# Patient Record
Sex: Female | Born: 1965 | Race: White | Hispanic: No | Marital: Married | Smoking: Former smoker
Health system: Southern US, Community
[De-identification: ages and names within clinical notes are randomized; demographics above are authoritative.]

## PROBLEM LIST (undated history)

## (undated) DIAGNOSIS — M545 Low back pain, unspecified: Secondary | ICD-10-CM

## (undated) DIAGNOSIS — M1990 Unspecified osteoarthritis, unspecified site: Secondary | ICD-10-CM

## (undated) DIAGNOSIS — G629 Polyneuropathy, unspecified: Secondary | ICD-10-CM

## (undated) DIAGNOSIS — T4145XA Adverse effect of unspecified anesthetic, initial encounter: Secondary | ICD-10-CM

## (undated) DIAGNOSIS — M542 Cervicalgia: Secondary | ICD-10-CM

## (undated) DIAGNOSIS — M62838 Other muscle spasm: Secondary | ICD-10-CM

## (undated) DIAGNOSIS — G709 Myoneural disorder, unspecified: Secondary | ICD-10-CM

## (undated) DIAGNOSIS — R011 Cardiac murmur, unspecified: Secondary | ICD-10-CM

## (undated) DIAGNOSIS — T8859XA Other complications of anesthesia, initial encounter: Secondary | ICD-10-CM

## (undated) DIAGNOSIS — E162 Hypoglycemia, unspecified: Secondary | ICD-10-CM

## (undated) DIAGNOSIS — R531 Weakness: Secondary | ICD-10-CM

## (undated) DIAGNOSIS — G8929 Other chronic pain: Secondary | ICD-10-CM

## (undated) DIAGNOSIS — Z8709 Personal history of other diseases of the respiratory system: Secondary | ICD-10-CM

## (undated) HISTORY — DX: Myoneural disorder, unspecified: G70.9

## (undated) HISTORY — PX: MOUTH SURGERY: SHX715

## (undated) HISTORY — PX: TUBAL LIGATION: SHX77

## (undated) HISTORY — PX: OTHER SURGICAL HISTORY: SHX169

---

## 2015-09-10 ENCOUNTER — Other Ambulatory Visit: Payer: Self-pay | Admitting: Orthopaedic Surgery

## 2015-09-10 DIAGNOSIS — M542 Cervicalgia: Secondary | ICD-10-CM

## 2015-09-15 ENCOUNTER — Ambulatory Visit
Admission: RE | Admit: 2015-09-15 | Discharge: 2015-09-15 | Disposition: A | Discharge status: Home / Self Care | Payer: 59 | Source: Ambulatory Visit | Attending: Orthopaedic Surgery | Admitting: Orthopaedic Surgery

## 2015-09-15 DIAGNOSIS — M542 Cervicalgia: Secondary | ICD-10-CM

## 2015-10-03 ENCOUNTER — Other Ambulatory Visit: Payer: Self-pay | Admitting: Neurosurgery

## 2015-10-10 NOTE — Pre-Procedure Instructions (Signed)
Reagyn Loretto  10/10/2015      [facility] - [place], [address] AT [address] [place] [address] Phone: [phone] Fax: [phone]    Your procedure is scheduled on Wed, June 14 @ 1:30 PM  Report to [facility] Admitting at 11:30 AM  Call this number if you have problems the morning of surgery:  [phone]   Remember:  Do not eat food or drink liquids after midnight.  Take these medicines the morning of surgery with A SIP OF WATER Gabapentin(Neurontin)              Stop taking your Meloxicam,Fish Oil,Vit E,along with any Vitamins or Herbal Medications. No Goody's,BC's,Aleve,Aspirin,Ibuprofen,or Advil.    Do not wear jewelry, make-up or nail polish.  Do not wear lotions, powders, or perfumes.    Do not shave 48 hours prior to surgery.   Do not bring valuables to the hospital.  [facility] is not responsible for any belongings or valuables.  Contacts, dentures or bridgework may not be worn into surgery.  Leave your suitcase in the car.  After surgery it may be brought to your room.  For patients admitted to the hospital, discharge time will be determined by your treatment team.  Patients discharged the day of surgery will not be allowed to drive home.    Special instructions:  - Preparing for Surgery  Before surgery, you can play an important role.  Because skin is not sterile, your skin needs to be as free of germs as possible.  You can reduce the number of germs on you skin by washing with CHG (chlorahexidine gluconate) soap before surgery.  CHG is an antiseptic cleaner which kills germs and bonds with the skin to continue killing germs even after washing.  Please DO NOT use if you have an allergy to CHG or antibacterial soaps.  If your skin becomes reddened/irritated stop using the CHG and inform your nurse when you arrive at Short Stay.  Do not shave  (including legs and underarms) for at least 48 hours prior to the first CHG shower.  You may shave your face.  Please follow these instructions carefully:   1.  Shower with CHG Soap the night before surgery and the                                morning of Surgery.  2.  If you choose to wash your hair, wash your hair first as usual with your       normal shampoo.  3.  After you shampoo, rinse your hair and body thoroughly to remove the                      Shampoo.  4.  Use CHG as you would any other liquid soap.  You can apply chg directly       to the skin and wash gently with scrungie or a clean washcloth.  5.  Apply the CHG Soap to your body ONLY FROM THE NECK DOWN.        Do not use on open wounds or open sores.  Avoid contact with your eyes,       ears, mouth and genitals (private parts).  Wash genitals (private parts)       with your normal soap.  6.  Wash thoroughly, paying special attention to the area where your surgery        will be performed.  7.  Thoroughly rinse your body with warm water from the neck down.  8.  DO NOT shower/wash with your normal soap after using and rinsing off       the CHG Soap.  9.  Pat yourself dry with a clean towel.            10.  Wear clean pajamas.            11.  Place clean sheets on your bed the night of your first shower and do not        sleep with pets.  Day of Surgery  Do not apply any lotions/deoderants the morning of surgery.  Please wear clean clothes to the hospital/surgery center.    Please read over the following fact sheets that you were given. MRSA Information

## 2015-10-11 ENCOUNTER — Encounter (HOSPITAL_COMMUNITY): Payer: Self-pay

## 2015-10-11 ENCOUNTER — Encounter (HOSPITAL_COMMUNITY)
Admission: RE | Admit: 2015-10-11 | Discharge: 2015-10-11 | Disposition: A | Discharge status: Home / Self Care | Payer: 59 | Source: Ambulatory Visit | Attending: Neurosurgery | Admitting: Neurosurgery

## 2015-10-11 DIAGNOSIS — M50221 Other cervical disc displacement at C4-C5 level: Secondary | ICD-10-CM | POA: Insufficient documentation

## 2015-10-11 DIAGNOSIS — Z01812 Encounter for preprocedural laboratory examination: Secondary | ICD-10-CM | POA: Insufficient documentation

## 2015-10-11 HISTORY — DX: Weakness: R53.1

## 2015-10-11 HISTORY — DX: Adverse effect of unspecified anesthetic, initial encounter: T41.45XA

## 2015-10-11 HISTORY — DX: Other complications of anesthesia, initial encounter: T88.59XA

## 2015-10-11 HISTORY — DX: Personal history of other diseases of the respiratory system: Z87.09

## 2015-10-11 HISTORY — DX: Other chronic pain: G89.29

## 2015-10-11 HISTORY — DX: Unspecified osteoarthritis, unspecified site: M19.90

## 2015-10-11 HISTORY — DX: Low back pain: M54.5

## 2015-10-11 HISTORY — DX: Other muscle spasm: M62.838

## 2015-10-11 HISTORY — DX: Polyneuropathy, unspecified: G62.9

## 2015-10-11 HISTORY — DX: Low back pain, unspecified: M54.50

## 2015-10-11 HISTORY — DX: Cardiac murmur, unspecified: R01.1

## 2015-10-11 HISTORY — DX: Cervicalgia: M54.2

## 2015-10-11 LAB — BASIC METABOLIC PANEL
ANION GAP: 5 (ref 5–15)
BUN: 12 mg/dL (ref 6–20)
CO2: 22 mmol/L (ref 22–32)
Calcium: 9.2 mg/dL (ref 8.9–10.3)
Chloride: 109 mmol/L (ref 101–111)
Creatinine, Ser: 0.68 mg/dL (ref 0.44–1.00)
GLUCOSE: 109 mg/dL — AB (ref 65–99)
POTASSIUM: 4 mmol/L (ref 3.5–5.1)
Sodium: 136 mmol/L (ref 135–145)

## 2015-10-11 LAB — CBC
HCT: 38.6 % (ref 36.0–46.0)
HEMOGLOBIN: 12.7 g/dL (ref 12.0–15.0)
MCH: 30 pg (ref 26.0–34.0)
MCHC: 32.9 g/dL (ref 30.0–36.0)
MCV: 91 fL (ref 78.0–100.0)
Platelets: 200 10*3/uL (ref 150–400)
RBC: 4.24 MIL/uL (ref 3.87–5.11)
RDW: 13.1 % (ref 11.5–15.5)
WBC: 7.7 10*3/uL (ref 4.0–10.5)

## 2015-10-11 LAB — HCG, SERUM, QUALITATIVE: PREG SERUM: NEGATIVE

## 2015-10-11 LAB — SURGICAL PCR SCREEN
MRSA, PCR: NEGATIVE
Staphylococcus aureus: NEGATIVE

## 2015-10-11 LAB — NO BLOOD PRODUCTS

## 2015-10-11 NOTE — Progress Notes (Addendum)
Cardiologist denies  Medical Md is with [facility] sees [place], [place]  Echo denies  Stress test deneis  Heart cath denies  EKG to be requested from [place] denies in past yr

## 2015-10-15 MED ORDER — DEXAMETHASONE SODIUM PHOSPHATE 10 MG/ML IJ SOLN
10.0000 mg | INTRAMUSCULAR | Status: AC
  Administered 2015-10-16: 10 mg via INTRAVENOUS

## 2015-10-16 ENCOUNTER — Inpatient Hospital Stay (HOSPITAL_COMMUNITY): Payer: 59

## 2015-10-16 ENCOUNTER — Inpatient Hospital Stay (HOSPITAL_COMMUNITY)
Admission: RE | Admit: 2015-10-16 | Discharge: 2015-10-17 | DRG: 472 | Disposition: A | Discharge status: Home / Self Care | Payer: 59 | Source: Ambulatory Visit | Attending: Neurosurgery | Admitting: Neurosurgery

## 2015-10-16 ENCOUNTER — Encounter (HOSPITAL_COMMUNITY)
Admission: RE | Disposition: A | Discharge status: Home / Self Care | Payer: Self-pay | Source: Ambulatory Visit | Attending: Neurosurgery

## 2015-10-16 ENCOUNTER — Encounter (HOSPITAL_COMMUNITY): Payer: Self-pay | Admitting: Surgery

## 2015-10-16 ENCOUNTER — Inpatient Hospital Stay (HOSPITAL_COMMUNITY): Payer: 59 | Admitting: Certified Registered Nurse Anesthetist

## 2015-10-16 DIAGNOSIS — M50021 Cervical disc disorder at C4-C5 level with myelopathy: Secondary | ICD-10-CM | POA: Diagnosis present

## 2015-10-16 DIAGNOSIS — Z888 Allergy status to other drugs, medicaments and biological substances status: Secondary | ICD-10-CM | POA: Diagnosis not present

## 2015-10-16 DIAGNOSIS — Z79899 Other long term (current) drug therapy: Secondary | ICD-10-CM

## 2015-10-16 DIAGNOSIS — G8929 Other chronic pain: Secondary | ICD-10-CM | POA: Diagnosis present

## 2015-10-16 DIAGNOSIS — M50022 Cervical disc disorder at C5-C6 level with myelopathy: Secondary | ICD-10-CM | POA: Diagnosis present

## 2015-10-16 DIAGNOSIS — Z880 Allergy status to penicillin: Secondary | ICD-10-CM

## 2015-10-16 DIAGNOSIS — M50023 Cervical disc disorder at C6-C7 level with myelopathy: Secondary | ICD-10-CM | POA: Diagnosis present

## 2015-10-16 DIAGNOSIS — Z882 Allergy status to sulfonamides status: Secondary | ICD-10-CM

## 2015-10-16 DIAGNOSIS — Z91040 Latex allergy status: Secondary | ICD-10-CM | POA: Diagnosis not present

## 2015-10-16 DIAGNOSIS — M47812 Spondylosis without myelopathy or radiculopathy, cervical region: Secondary | ICD-10-CM | POA: Diagnosis present

## 2015-10-16 DIAGNOSIS — M419 Scoliosis, unspecified: Secondary | ICD-10-CM | POA: Diagnosis present

## 2015-10-16 DIAGNOSIS — M4712 Other spondylosis with myelopathy, cervical region: Principal | ICD-10-CM | POA: Diagnosis present

## 2015-10-16 DIAGNOSIS — F17210 Nicotine dependence, cigarettes, uncomplicated: Secondary | ICD-10-CM | POA: Diagnosis present

## 2015-10-16 DIAGNOSIS — Z419 Encounter for procedure for purposes other than remedying health state, unspecified: Secondary | ICD-10-CM

## 2015-10-16 DIAGNOSIS — G629 Polyneuropathy, unspecified: Secondary | ICD-10-CM | POA: Diagnosis present

## 2015-10-16 DIAGNOSIS — M1990 Unspecified osteoarthritis, unspecified site: Secondary | ICD-10-CM | POA: Diagnosis present

## 2015-10-16 HISTORY — PX: ANTERIOR CERVICAL DECOMP/DISCECTOMY FUSION: SHX1161

## 2015-10-16 HISTORY — DX: Hypoglycemia, unspecified: E16.2

## 2015-10-16 SURGERY — ANTERIOR CERVICAL DECOMPRESSION/DISCECTOMY FUSION 3 LEVELS
Anesthesia: General | Site: Neck

## 2015-10-16 MED ORDER — VITAMIN D 1000 UNITS PO TABS: 1000.0000 [IU] | ORAL_TABLET | Freq: Every day | ORAL | Status: DC

## 2015-10-16 MED ORDER — ONDANSETRON HCL 4 MG/2ML IJ SOLN
INTRAMUSCULAR | Status: AC
  Filled 2015-10-16: qty 2

## 2015-10-16 MED ORDER — PHENOL 1.4 % MT LIQD: 1.0000 | OROMUCOSAL | Status: DC | PRN

## 2015-10-16 MED ORDER — THROMBIN 5000 UNITS EX SOLR
OROMUCOSAL | Status: DC | PRN
  Administered 2015-10-16: 13:00:00 via TOPICAL

## 2015-10-16 MED ORDER — LIDOCAINE 2% (20 MG/ML) 5 ML SYRINGE
INTRAMUSCULAR | Status: DC | PRN
  Administered 2015-10-16: 100 mg via INTRAVENOUS

## 2015-10-16 MED ORDER — FENTANYL CITRATE (PF) 250 MCG/5ML IJ SOLN
INTRAMUSCULAR | Status: DC | PRN
  Administered 2015-10-16: 150 ug via INTRAVENOUS
  Administered 2015-10-16 (×2): 50 ug via INTRAVENOUS

## 2015-10-16 MED ORDER — MIDAZOLAM HCL 2 MG/2ML IJ SOLN
INTRAMUSCULAR | Status: AC
  Filled 2015-10-16: qty 2

## 2015-10-16 MED ORDER — THROMBIN 20000 UNITS EX SOLR
CUTANEOUS | Status: DC | PRN
  Administered 2015-10-16: 13:00:00 via TOPICAL

## 2015-10-16 MED ORDER — ROCURONIUM BROMIDE 100 MG/10ML IV SOLN
INTRAVENOUS | Status: DC | PRN
  Administered 2015-10-16: 50 mg via INTRAVENOUS

## 2015-10-16 MED ORDER — VITAMIN E 180 MG (400 UNIT) PO CAPS: 400.0000 [IU] | ORAL_CAPSULE | Freq: Every day | ORAL | Status: DC

## 2015-10-16 MED ORDER — ONDANSETRON HCL 4 MG/2ML IJ SOLN
INTRAMUSCULAR | Status: DC | PRN
  Administered 2015-10-16: 4 mg via INTRAVENOUS

## 2015-10-16 MED ORDER — LACTATED RINGERS IV SOLN
INTRAVENOUS | Status: DC
  Administered 2015-10-16 (×3): via INTRAVENOUS

## 2015-10-16 MED ORDER — OMEGA-3-ACID ETHYL ESTERS 1 G PO CAPS
1.0000 g | ORAL_CAPSULE | Freq: Every day | ORAL | Status: DC
Start: 2015-10-16 — End: 2015-10-17
  Filled 2015-10-16 (×2): qty 1

## 2015-10-16 MED ORDER — TIZANIDINE HCL 4 MG PO TABS
4.0000 mg | ORAL_TABLET | Freq: Two times a day (BID) | ORAL | Status: DC
  Administered 2015-10-16: 4 mg via ORAL
  Filled 2015-10-16: qty 1

## 2015-10-16 MED ORDER — VANCOMYCIN HCL IN DEXTROSE 1-5 GM/200ML-% IV SOLN
1000.0000 mg | Freq: Once | INTRAVENOUS | Status: AC
  Administered 2015-10-16: 1000 mg via INTRAVENOUS
  Filled 2015-10-16: qty 200

## 2015-10-16 MED ORDER — FENTANYL CITRATE (PF) 100 MCG/2ML IJ SOLN
INTRAMUSCULAR | Status: AC
  Filled 2015-10-16: qty 2

## 2015-10-16 MED ORDER — EPHEDRINE 5 MG/ML INJ
INTRAVENOUS | Status: AC
  Filled 2015-10-16: qty 10

## 2015-10-16 MED ORDER — SUGAMMADEX SODIUM 200 MG/2ML IV SOLN
INTRAVENOUS | Status: DC | PRN
  Administered 2015-10-16: 180 mg via INTRAVENOUS

## 2015-10-16 MED ORDER — PHENYLEPHRINE HCL 10 MG/ML IJ SOLN
10.0000 mg | INTRAVENOUS | Status: DC | PRN
  Administered 2015-10-16: 25 ug/min via INTRAVENOUS

## 2015-10-16 MED ORDER — MIDAZOLAM HCL 2 MG/2ML IJ SOLN
INTRAMUSCULAR | Status: DC | PRN
Start: 2015-10-16 — End: 2015-10-16
  Administered 2015-10-16: 2 mg via INTRAVENOUS

## 2015-10-16 MED ORDER — MENTHOL 3 MG MT LOZG: 1.0000 | LOZENGE | OROMUCOSAL | Status: DC | PRN

## 2015-10-16 MED ORDER — FENTANYL CITRATE (PF) 100 MCG/2ML IJ SOLN
25.0000 ug | INTRAMUSCULAR | Status: DC | PRN
Start: 2015-10-16 — End: 2015-10-16
  Administered 2015-10-16 (×2): 50 ug via INTRAVENOUS

## 2015-10-16 MED ORDER — CYCLOBENZAPRINE HCL 10 MG PO TABS: 10.0000 mg | ORAL_TABLET | Freq: Three times a day (TID) | ORAL | Status: DC | PRN

## 2015-10-16 MED ORDER — SUGAMMADEX SODIUM 200 MG/2ML IV SOLN
INTRAVENOUS | Status: AC
  Filled 2015-10-16: qty 2

## 2015-10-16 MED ORDER — ROCURONIUM BROMIDE 50 MG/5ML IV SOLN
INTRAVENOUS | Status: AC
  Filled 2015-10-16: qty 1

## 2015-10-16 MED ORDER — CEFAZOLIN SODIUM 1-5 GM-% IV SOLN: 1.0000 g | Freq: Three times a day (TID) | INTRAVENOUS | Status: DC

## 2015-10-16 MED ORDER — MAGNESIUM 250 MG PO TABS: 1.0000 | ORAL_TABLET | Freq: Every day | ORAL | Status: DC

## 2015-10-16 MED ORDER — HYDROMORPHONE HCL 1 MG/ML IJ SOLN
0.5000 mg | INTRAMUSCULAR | Status: DC | PRN
  Administered 2015-10-16: 1 mg via INTRAVENOUS
  Filled 2015-10-16: qty 1

## 2015-10-16 MED ORDER — 0.9 % SODIUM CHLORIDE (POUR BTL) OPTIME
TOPICAL | Status: DC | PRN
  Administered 2015-10-16: 1000 mL

## 2015-10-16 MED ORDER — OXYCODONE-ACETAMINOPHEN 5-325 MG PO TABS
1.0000 | ORAL_TABLET | ORAL | Status: DC | PRN
  Administered 2015-10-17: 2 via ORAL
  Filled 2015-10-16: qty 2

## 2015-10-16 MED ORDER — LIDOCAINE 2% (20 MG/ML) 5 ML SYRINGE
INTRAMUSCULAR | Status: AC
  Filled 2015-10-16: qty 5

## 2015-10-16 MED ORDER — SODIUM CHLORIDE 0.9 % IR SOLN
Status: DC | PRN
  Administered 2015-10-16: 500 mL

## 2015-10-16 MED ORDER — ONDANSETRON HCL 4 MG/2ML IJ SOLN: 4.0000 mg | INTRAMUSCULAR | Status: DC | PRN

## 2015-10-16 MED ORDER — MAGNESIUM OXIDE 400 (241.3 MG) MG PO TABS
400.0000 mg | ORAL_TABLET | Freq: Every day | ORAL | Status: DC
  Filled 2015-10-16 (×2): qty 1

## 2015-10-16 MED ORDER — DEXAMETHASONE SODIUM PHOSPHATE 10 MG/ML IJ SOLN
INTRAMUSCULAR | Status: AC
  Filled 2015-10-16: qty 1

## 2015-10-16 MED ORDER — VITAMIN B-6 50 MG PO TABS
250.0000 mg | ORAL_TABLET | Freq: Every day | ORAL | Status: DC
  Filled 2015-10-16 (×2): qty 1

## 2015-10-16 MED ORDER — MUSCLE RUB 10-15 % EX CREA
TOPICAL_CREAM | CUTANEOUS | Status: DC | PRN
Start: 2015-10-16 — End: 2015-10-17
  Filled 2015-10-16: qty 85

## 2015-10-16 MED ORDER — ACETAMINOPHEN 650 MG RE SUPP: 650.0000 mg | RECTAL | Status: DC | PRN

## 2015-10-16 MED ORDER — FENTANYL CITRATE (PF) 250 MCG/5ML IJ SOLN
INTRAMUSCULAR | Status: AC
  Filled 2015-10-16: qty 5

## 2015-10-16 MED ORDER — LACTATED RINGERS IV SOLN: INTRAVENOUS | Status: DC

## 2015-10-16 MED ORDER — VANCOMYCIN HCL IN DEXTROSE 1-5 GM/200ML-% IV SOLN
INTRAVENOUS | Status: AC
  Administered 2015-10-16: 1000 mg via INTRAVENOUS
  Filled 2015-10-16: qty 200

## 2015-10-16 MED ORDER — ACETAMINOPHEN 325 MG PO TABS: 650.0000 mg | ORAL_TABLET | ORAL | Status: DC | PRN

## 2015-10-16 MED ORDER — MEPERIDINE HCL 25 MG/ML IJ SOLN: 6.2500 mg | INTRAMUSCULAR | Status: DC | PRN

## 2015-10-16 MED ORDER — LIDOCAINE-MENTHOL 4-1 % EX CREA: 1.0000 "application " | TOPICAL_CREAM | Freq: Two times a day (BID) | CUTANEOUS | Status: DC | PRN

## 2015-10-16 MED ORDER — MELOXICAM 7.5 MG PO TABS
7.5000 mg | ORAL_TABLET | Freq: Two times a day (BID) | ORAL | Status: DC
  Administered 2015-10-16: 7.5 mg via ORAL
  Filled 2015-10-16 (×2): qty 1

## 2015-10-16 MED ORDER — PROPOFOL 10 MG/ML IV BOLUS
INTRAVENOUS | Status: AC
  Filled 2015-10-16: qty 20

## 2015-10-16 MED ORDER — SODIUM CHLORIDE 0.9% FLUSH: 3.0000 mL | INTRAVENOUS | Status: DC | PRN

## 2015-10-16 MED ORDER — SODIUM CHLORIDE 0.9% FLUSH
3.0000 mL | Freq: Two times a day (BID) | INTRAVENOUS | Status: DC
  Administered 2015-10-16: 3 mL via INTRAVENOUS

## 2015-10-16 MED ORDER — VITAMIN D2 50 MCG (2000 UT) PO TABS: 1.0000 | ORAL_TABLET | Freq: Every day | ORAL | Status: DC

## 2015-10-16 MED ORDER — SODIUM CHLORIDE 0.9 % IV SOLN
250.0000 mL | INTRAVENOUS | Status: DC
  Administered 2015-10-16: 250 mL via INTRAVENOUS

## 2015-10-16 MED ORDER — PROPOFOL 10 MG/ML IV BOLUS
INTRAVENOUS | Status: DC | PRN
  Administered 2015-10-16: 200 mg via INTRAVENOUS

## 2015-10-16 MED ORDER — SCOPOLAMINE 1 MG/3DAYS TD PT72
MEDICATED_PATCH | TRANSDERMAL | Status: AC
Start: 2015-10-16 — End: 2015-10-16
  Administered 2015-10-16: 1 via TRANSDERMAL
  Filled 2015-10-16: qty 1

## 2015-10-16 MED ORDER — METOCLOPRAMIDE HCL 5 MG/ML IJ SOLN: 10.0000 mg | Freq: Once | INTRAMUSCULAR | Status: DC | PRN

## 2015-10-16 MED ORDER — GABAPENTIN 300 MG PO CAPS
300.0000 mg | ORAL_CAPSULE | Freq: Two times a day (BID) | ORAL | Status: DC
  Administered 2015-10-16: 300 mg via ORAL
  Filled 2015-10-16: qty 1

## 2015-10-16 SURGICAL SUPPLY — 71 items
BAG DECANTER FOR FLEXI CONT (MISCELLANEOUS) ×2 IMPLANT
BENZOIN TINCTURE PRP APPL 2/3 (GAUZE/BANDAGES/DRESSINGS) ×2 IMPLANT
BIT DRILL 13 (BIT) ×2 IMPLANT
BRUSH SCRUB EZ PLAIN DRY (MISCELLANEOUS) ×2 IMPLANT
BUR MATCHSTICK NEURO 3.0 LAGG (BURR) ×2 IMPLANT
CANISTER SUCT 3000ML PPV (MISCELLANEOUS) ×2 IMPLANT
CHLORAPREP W/TINT 26ML (MISCELLANEOUS) ×2 IMPLANT
DECANTER SPIKE VIAL GLASS SM (MISCELLANEOUS) IMPLANT
DRAIN SNY WOU 7FLT (WOUND CARE) IMPLANT
DRAPE C-ARM 42X72 X-RAY (DRAPES) ×4 IMPLANT
DRAPE LAPAROTOMY 100X72 PEDS (DRAPES) ×2 IMPLANT
DRAPE MICROSCOPE LEICA (MISCELLANEOUS) ×2 IMPLANT
DRAPE POUCH INSTRU U-SHP 10X18 (DRAPES) ×2 IMPLANT
DRSG OPSITE POSTOP 4X6 (GAUZE/BANDAGES/DRESSINGS) ×2 IMPLANT
DRSG TELFA 3X8 NADH (GAUZE/BANDAGES/DRESSINGS) ×2 IMPLANT
DURAPREP 6ML APPLICATOR 50/CS (WOUND CARE) IMPLANT
ELECT COATED BLADE 2.86 ST (ELECTRODE) ×2 IMPLANT
ELECT REM PT RETURN 9FT ADLT (ELECTROSURGICAL) ×2
ELECTRODE REM PT RTRN 9FT ADLT (ELECTROSURGICAL) ×1 IMPLANT
EVACUATOR SILICONE 100CC (DRAIN) ×2 IMPLANT
GAUZE SPONGE 4X4 12PLY STRL (GAUZE/BANDAGES/DRESSINGS) ×2 IMPLANT
GAUZE SPONGE 4X4 16PLY XRAY LF (GAUZE/BANDAGES/DRESSINGS) IMPLANT
GLOVE BIO SURGEON STRL SZ8 (GLOVE) IMPLANT
GLOVE BIOGEL PI IND STRL 7.0 (GLOVE) ×4 IMPLANT
GLOVE BIOGEL PI IND STRL 7.5 (GLOVE) ×1 IMPLANT
GLOVE BIOGEL PI IND STRL 8.5 (GLOVE) ×1 IMPLANT
GLOVE BIOGEL PI INDICATOR 7.0 (GLOVE) ×4
GLOVE BIOGEL PI INDICATOR 7.5 (GLOVE) ×1
GLOVE BIOGEL PI INDICATOR 8.5 (GLOVE) ×1
GLOVE EXAM NITRILE LRG STRL (GLOVE) IMPLANT
GLOVE EXAM NITRILE MD LF STRL (GLOVE) IMPLANT
GLOVE EXAM NITRILE XL STR (GLOVE) IMPLANT
GLOVE EXAM NITRILE XS STR PU (GLOVE) IMPLANT
GLOVE INDICATOR 8.5 STRL (GLOVE) IMPLANT
GLOVE SURG SS PI 6.5 STRL IVOR (GLOVE) ×4 IMPLANT
GLOVE SURG SS PI 7.0 STRL IVOR (GLOVE) ×12 IMPLANT
GLOVE SURG SS PI 8.0 STRL IVOR (GLOVE) ×2 IMPLANT
GLOVE SURG SS PI 8.5 STRL IVOR (GLOVE) ×1
GLOVE SURG SS PI 8.5 STRL STRW (GLOVE) ×1 IMPLANT
GOWN STRL REUS W/ TWL LRG LVL3 (GOWN DISPOSABLE) IMPLANT
GOWN STRL REUS W/ TWL XL LVL3 (GOWN DISPOSABLE) ×1 IMPLANT
GOWN STRL REUS W/TWL 2XL LVL3 (GOWN DISPOSABLE) IMPLANT
GOWN STRL REUS W/TWL LRG LVL3 (GOWN DISPOSABLE)
GOWN STRL REUS W/TWL XL LVL3 (GOWN DISPOSABLE) ×1
HALTER HD/CHIN CERV TRACTION D (MISCELLANEOUS) ×2 IMPLANT
HEMOSTAT POWDER KIT SURGIFOAM (HEMOSTASIS) ×2 IMPLANT
KIT BASIN OR (CUSTOM PROCEDURE TRAY) ×2 IMPLANT
KIT ROOM TURNOVER OR (KITS) ×2 IMPLANT
LIQUID BAND (GAUZE/BANDAGES/DRESSINGS) ×2 IMPLANT
NEEDLE SPNL 20GX3.5 QUINCKE YW (NEEDLE) ×2 IMPLANT
NS IRRIG 1000ML POUR BTL (IV SOLUTION) ×2 IMPLANT
PACK LAMINECTOMY NEURO (CUSTOM PROCEDURE TRAY) ×2 IMPLANT
PLATE 3 57.5XLCK NS SPNE CVD (Plate) ×1 IMPLANT
PLATE 3 ATLANTIS TRANS (Plate) ×1 IMPLANT
RUBBERBAND STERILE (MISCELLANEOUS) ×4 IMPLANT
SCREW ST FIX 4 ATL 3120213 (Screw) ×16 IMPLANT
SPACER CERV FRGE 12X14X6-0 (Spacer) ×2 IMPLANT
SPACER CERV FRGE 12X14X7-0 (Spacer) ×2 IMPLANT
SPACER CERVICAL FRGE 12X14X6-7 (Spacer) ×2 IMPLANT
SPONGE INTESTINAL PEANUT (DISPOSABLE) ×2 IMPLANT
SPONGE SURGIFOAM ABS GEL 100 (HEMOSTASIS) ×2 IMPLANT
STRIP CLOSURE SKIN 1/2X4 (GAUZE/BANDAGES/DRESSINGS) ×2 IMPLANT
SUT VIC AB 3-0 SH 8-18 (SUTURE) ×2 IMPLANT
SUT VICRYL 4-0 PS2 18IN ABS (SUTURE) ×2 IMPLANT
TAPE CLOTH 4X10 WHT NS (GAUZE/BANDAGES/DRESSINGS) IMPLANT
TAPE PAPER 2X10 WHT MICROPORE (GAUZE/BANDAGES/DRESSINGS) ×2 IMPLANT
TOWEL OR 17X24 6PK STRL BLUE (TOWEL DISPOSABLE) ×2 IMPLANT
TOWEL OR 17X26 10 PK STRL BLUE (TOWEL DISPOSABLE) ×2 IMPLANT
TRAP SPECIMEN MUCOUS 40CC (MISCELLANEOUS) ×2 IMPLANT
TRAY FOLEY BAG SILVER LF 16FR (SET/KITS/TRAYS/PACK) ×2 IMPLANT
WATER STERILE IRR 1000ML POUR (IV SOLUTION) ×2 IMPLANT

## 2015-10-16 NOTE — Anesthesia Procedure Notes (Signed)
Procedure Name: Intubation Date/Time: 10/16/2015 11:06 AM Performed by: Trixie Deis A Pre-anesthesia Checklist: Patient identified, Timeout performed, Suction available, Emergency Drugs available and Patient being monitored Patient Re-evaluated:Patient Re-evaluated prior to inductionOxygen Delivery Method: Circle system utilized Preoxygenation: Pre-oxygenation with 100% oxygen Intubation Type: IV induction Ventilation: Mask ventilation without difficulty Laryngoscope Size: Glidescope and 3 Grade View: Grade I Tube type: Oral Tube size: 7.0 mm Number of attempts: 1 Airway Equipment and Method: Rigid stylet and Video-laryngoscopy Placement Confirmation: ETT inserted through vocal cords under direct vision,  breath sounds checked- equal and bilateral and positive ETCO2 Secured at: 20 cm Tube secured with: Tape Dental Injury: Teeth and Oropharynx as per pre-operative assessment

## 2015-10-16 NOTE — Anesthesia Postprocedure Evaluation (Signed)
Anesthesia Post Note  Patient: Bonnie Bailey  Procedure(s) Performed: Procedure(s) (LRB): Anterior cervical decompression fusion cervical four-five, cervical five-six, cervical six-seven (N/A)  Patient location during evaluation: PACU Anesthesia Type: General Level of consciousness: awake and alert Pain management: pain level controlled Vital Signs Assessment: post-procedure vital signs reviewed and stable Respiratory status: spontaneous breathing, nonlabored ventilation, respiratory function stable and patient connected to nasal cannula oxygen Cardiovascular status: blood pressure returned to baseline and stable Postop Assessment: no signs of nausea or vomiting Anesthetic complications: no    Last Vitals:  Filed Vitals:   10/16/15 1430 10/16/15 1439  BP: 113/64   Pulse: 64 71  Temp:    Resp: 39 20    Last Pain:  Filed Vitals:   10/16/15 1440  PainSc: Asleep                 Montez Hageman

## 2015-10-16 NOTE — Anesthesia Preprocedure Evaluation (Signed)
Anesthesia Evaluation  Patient identified by MRN, date of birth, ID band Patient awake    Reviewed: Allergy & Precautions, NPO status , Patient's Chart, lab work & pertinent test results  Airway Mallampati: II  TM Distance: >3 FB Neck ROM: Full    Dental no notable dental hx. (+) Dental Advisory Given Extensive dental work. Implants. Bonded front teeth:   Pulmonary Current Smoker,    Pulmonary exam normal breath sounds clear to auscultation       Cardiovascular negative cardio ROS Normal cardiovascular exam Rhythm:Regular Rate:Normal     Neuro/Psych negative neurological ROS  negative psych ROS   GI/Hepatic negative GI ROS, Neg liver ROS,   Endo/Other  negative endocrine ROS  Renal/GU negative Renal ROS  negative genitourinary   Musculoskeletal negative musculoskeletal ROS (+)   Abdominal   Peds negative pediatric ROS (+)  Hematology negative hematology ROS (+)   Anesthesia Other Findings   Reproductive/Obstetrics negative OB ROS                             Anesthesia Physical Anesthesia Plan  ASA: II  Anesthesia Plan: General   Post-op Pain Management:    Induction: Intravenous  Airway Management Planned: Oral ETT  Additional Equipment:   Intra-op Plan:   Post-operative Plan: Extubation in OR  Informed Consent: I have reviewed the patients History and Physical, chart, labs and discussed the procedure including the risks, benefits and alternatives for the proposed anesthesia with the patient or authorized representative who has indicated his/her understanding and acceptance.   Dental advisory given  Plan Discussed with: CRNA  Anesthesia Plan Comments:         Anesthesia Quick Evaluation

## 2015-10-16 NOTE — Progress Notes (Signed)
Orthopedic Tech Progress Note Patient Details:  Bonnie Bailey 07-30-1965 [id number] Patient already has soft collar. Patient ID: [name], female   DOB: [birth year], 50 y.o.   MRN: [id number]   Braulio Bosch 10/16/2015, 4:37 PM

## 2015-10-16 NOTE — Op Note (Signed)
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  Postoperative diagnosis: Same  Procedure: Anterior cervical discectomies and fusion at C4-5, C5-6, C6-7 utilizing allograft wedges and the Atlantis translational plating system.  Surgeon: Dominica Severin Mylz Yuan  Asst.: Kristeen Miss  Anesthesia: Gen.  EBL: Minimal  History of present illness: Patient was brought into the or was induced under general anesthesia positioned supine the neck in slight extension in 5 pounds of halter traction the right 7 neck was prepped and draped in routine sterile fashion. Preoperative x-ray localize the appropriate level so a curvilinear incision was made just off midline to the anterior border of sternomastoid. The superficial layer of the platysmas dissected out and divided longitudinally the avascular plane to sternomastoid and strap muscles was developed down to the prevertebral fascia. Fascia dissected with Kitners. Interoperative x-ray confirmed the location C5-6 disc space levels and longus close reflected laterally exposing all 3 disks at C4-5, C5-6, and C6-7. All 3 disks were further incised and the osteophytes removed with a Leksell rongeur and a 2 and 3 mm Kerrison punch. Then both disc spaces were scraped and drilled down the posterior annulus and osteo-comp looks. Under Mike's cup combination first working at C4-5 aggressive unamenable endplates removed a large free fragment disc herniation migrating centrally and underneath C4 and C5 vertebral bodies. Also dense phosphates were also removed the back of C4 and C5 vertebral bodies. I carried this laterally to the level of C5 pedicle and decompress C5 nerve roots flush with pedicle. After this I inserted a 6 mm lordotic allograft wedge. Then working at C5-6 and C6-7 first at C6-7 this this was further also drilled down large posterior spur was causing severe central stenosis and  compression spinal cord this was aggressively under bitten and decompress the central canal. Both C7 pedicles were identified both C7 nerve roots skeletonized flush with pedicle awl large foraminal discoloration was removed from the left C7 neuroforamen. Then at C5-6 and a similar fashion the pathology here was mostly large spur coming off the posterior aspect of each vertebral body this is all aggressively under been decompress and the central canal both C6 nerve is a skeletonized flush with pedicle and decompress. A 6 mm parallel graft was inserted C5-6-1 7 mm parallel C6-7 and a 57.5 Atlantis and translational plate was elected all screws excellent purchase locking mechanisms were engaged. The wounds and copious irrigated and meticulous hemostasis was maintained a drain was placed the wounds closed in layers with after Vicryl in the platysma and a running 4 subcuticular. Dermabond Telfa sterile dressing were applied and patient recovered in stable condition. At the end of case all needle counts sponge counts were correct.

## 2015-10-16 NOTE — H&P (Signed)
Bonnie Bailey is an 50 y.o. female.   Chief Complaint: Neck and left arm pain HPI: Patient is a very pleasant 50 year old female is a progress worsening neck and left arm pain rating down to her thumb and first finger of the left and consistent with C6 and C7 nerve root pattern. Workup revealed severe spondylosis stenosis cord compression at C4-5 C5-6 and C6-7. Due to patient's failure conservative treatment imaging findings and progressive clinical syndrome I recommended anterior cervical discectomies and fusion at C4-5, C5-6, C6-7. I've extensively gone over the risks and benefits of the operation with the patient as well as perioperative course expectations of outcome and alternatives of surgery and she understands and agrees to proceed forward.  Past Medical History  Diagnosis Date  . Complication of anesthesia     hard to wake  . Heart murmur     as a baby. Medical Md occasionally hears it now  . History of bronchitis as a child   . Weakness     numbness and tingling on left side  . Neuropathy (HCC)     right side   . Muscle spasm     takes Zanaflex daily as needed  . Osteoarthritis   . Chronic neck pain     HNP  . Low back pain     DDD.scoliosis  . Hypoglycemia     Past Surgical History  Procedure Laterality Date  . 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 Cesarean section  2000  . Mouth surgery      multiple  . Tubal ligation      History reviewed. No pertinent family history. Social History:  reports that she has been smoking Cigarettes.  She has a 10 pack-year smoking history. She does not have any smokeless tobacco history on file. She reports that she does not drink alcohol or use illicit drugs.  Allergies:  Allergies  Allergen Reactions  . Penicillins Anaphylaxis  . Sulfa Antibiotics Anaphylaxis  . Iodine Other (See Comments)    Skin Reaction   . Latex Hives  . Adhesive [Tape] Other (See Comments)    Needs paper tape    Medications Prior to Admission   Medication Sig Dispense Refill  . Ergocalciferol (VITAMIN D2) 2000 units TABS Take 1 tablet by mouth daily.    Marland Kitchen gabapentin (NEURONTIN) 300 MG capsule Take 300 mg by mouth 2 (two) times daily.    . Glucosamine-Chondroitin (MOVE FREE PO) Take 2 tablets by mouth daily.    . Lidocaine-Menthol 4-1 % CREA Apply 1 application topically 2 (two) times daily as needed (ICY HOT).    . Magnesium 250 MG TABS Take 1 tablet by mouth daily.    . meloxicam (MOBIC) 7.5 MG tablet Take 7.5 mg by mouth 2 (two) times daily.    . Omega-3 Fatty Acids (FISH OIL) 1000 MG CAPS Take 2 capsules by mouth daily.    . Pyridoxine HCl (VITAMIN B-6) 250 MG tablet Take 250 mg by mouth daily.    Marland Kitchen tiZANidine (ZANAFLEX) 4 MG tablet Take 4 mg by mouth 2 (two) times daily.    . vitamin E 400 UNIT capsule Take 400 Units by mouth daily.      No results found for this or any previous visit (from the past 48 hour(s)). No results found.  Review of Systems  Constitutional: Negative.   Eyes: Negative.   Respiratory: Negative.   Cardiovascular: Negative.   Gastrointestinal: Negative.   Genitourinary:  Negative.   Musculoskeletal: Positive for myalgias, joint pain and neck pain.  Skin: Negative.   Neurological: Positive for tingling and sensory change.    Blood pressure 114/71, pulse 67, temperature 99 F (37.2 C), temperature source Oral, resp. rate 18, last menstrual period 09/30/2015, SpO2 99 %. Physical Exam  Constitutional: She is oriented to person, place, and time. She appears well-developed and well-nourished.  HENT:  Head: Normocephalic.  Eyes: Pupils are equal, round, and reactive to light.  Neck: Normal range of motion.  Respiratory: Effort normal.  GI: Soft.  Neurological: She is alert and oriented to person, place, and time. She has normal strength. GCS eye subscore is 4. GCS verbal subscore is 5. GCS motor subscore is 6.  Patient is awake and alert strength right upper cavity is 5 out of 5 in deltoid biceps  triceps wrist flexion extension and intrinsics. Left upper to me has weakness of her left tricep but 4 out of 5 biceps 4+ out of 5 grip strength 4+ out of 5.     Assessment/Plan 50 year old female presents for an ACDF C4-C7  Allysa Governale P, MD 10/16/2015, 10:52 AM

## 2015-10-16 NOTE — Transfer of Care (Signed)
Immediate Anesthesia Transfer of Care Note  Patient: Bonnie Bailey  Procedure(s) Performed: Procedure(s): Anterior cervical decompression fusion cervical four-five, cervical five-six, cervical six-seven (N/A)  Patient Location: PACU  Anesthesia Type:General  Level of Consciousness: awake, alert  and oriented  Airway & Oxygen Therapy: Patient Spontanous Breathing and Patient connected to nasal cannula oxygen  Post-op Assessment: Report given to RN, Post -op Vital signs reviewed and stable and Patient moving all extremities  Post vital signs: Reviewed and stable  Last Vitals:  Filed Vitals:   10/16/15 0858 10/16/15 1345  BP: 114/71   Pulse: 67 71  Temp: 37.2 C 36.4 C  Resp: 18     Last Pain:  Filed Vitals:   10/16/15 1349  PainSc: 7          Complications: No apparent anesthesia complications

## 2015-10-17 ENCOUNTER — Encounter (HOSPITAL_COMMUNITY): Payer: Self-pay | Admitting: Neurosurgery

## 2015-10-17 MED ORDER — CYCLOBENZAPRINE HCL 10 MG PO TABS: 10.0000 mg | ORAL_TABLET | Freq: Three times a day (TID) | ORAL | Status: DC | PRN

## 2015-10-17 MED ORDER — OXYCODONE-ACETAMINOPHEN 5-325 MG PO TABS: 1.0000 | ORAL_TABLET | ORAL | Status: DC | PRN

## 2015-10-17 NOTE — Progress Notes (Signed)
Patient ID: Bonnie Bailey, female   DOB: 1966/02/11, 50 y.o.   MRN: [id number] doing well  No arm pain  Dc

## 2015-10-17 NOTE — Progress Notes (Signed)
Pt doing well. Pt given D/C instructions with Rx's, verbal understanding was provided. Pt's IV and JP were removed prior to D/C. Pt's incision is clean and dry with no sign of infection. Pt D/C'd home via wheelchair @ 1030 per MD order. Pt is stable @ D/C and has no other needs at this time. Holli Humbles, RN

## 2015-10-17 NOTE — Discharge Summary (Signed)
  Physician Discharge Summary  Patient ID: Bonnie Bailey MRN: [id number] DOB/AGE: Sep 16, 1965 50 y.o.  Admit date: 10/16/2015 Discharge date: 10/17/2015  Admission Diagnoses:Cervical spondylitic myelopathy  Discharge Diagnoses: Same Active Problems:   Spondylosis of cervical spine   Discharged Condition: good  Hospital Course: Patient admitted hospital underwent anterior cervical discectomies and fusion postop the patient a very well recovered in the floor on the floor was angling and voiding spontaneous he tolerated very bad stable for discharge home.  Consults: Significant Diagnostic Studies: Treatments: ACDF C4-5, C5-6, C6-7 Discharge Exam: Blood pressure 109/68, pulse 54, temperature 98.1 F (36.7 C), temperature source Oral, resp. rate 18, last menstrual period 09/30/2015, SpO2 98 %. Strength out of 5 wound clean dry and intact  Disposition: Home     Medication List    TAKE these medications        cyclobenzaprine 10 MG tablet  Commonly known as:  FLEXERIL  Take 1 tablet (10 mg total) by mouth 3 (three) times daily as needed for muscle spasms.     Fish Oil 1000 MG Caps  Take 2 capsules by mouth daily.     gabapentin 300 MG capsule  Commonly known as:  NEURONTIN  Take 300 mg by mouth 2 (two) times daily.     Lidocaine-Menthol 4-1 % Crea  Apply 1 application topically 2 (two) times daily as needed (ICY HOT).     Magnesium 250 MG Tabs  Take 1 tablet by mouth daily.     meloxicam 7.5 MG tablet  Commonly known as:  MOBIC  Take 7.5 mg by mouth 2 (two) times daily.     MOVE FREE PO  Take 2 tablets by mouth daily.     oxyCODONE-acetaminophen 5-325 MG tablet  Commonly known as:  PERCOCET/ROXICET  Take 1-2 tablets by mouth every 4 (four) hours as needed for moderate pain.     tiZANidine 4 MG tablet  Commonly known as:  ZANAFLEX  Take 4 mg by mouth 2 (two) times daily.     vitamin B-6 250 MG tablet  Take 250 mg by mouth daily.     Vitamin D2 2000 units Tabs   Take 1 tablet by mouth daily.     vitamin E 400 UNIT capsule  Take 400 Units by mouth daily.           Follow-up Information    Follow up with [facility], MD.   Specialty:  Neurosurgery   Contact information:   [address] [place] [address] [place] [address] [phone]       Signed: Elaina Hoops 10/17/2015, 8:50 AM

## 2015-10-17 NOTE — Discharge Instructions (Signed)
No lifting no bending no twisting no driving  Wound Care Keep incision covered and dry for one week.  If you shower prior to then, cover incision with plastic wrap.  You may remove outer bandage after one week and shower.  Do not put any creams, lotions, or ointments on incision. Leave steri-strips on neck.  They will fall off by themselves. Activity Walk each and every day, increasing distance each day. No lifting greater than 5 lbs.  Avoid excessive neck motion. No driving for 2 weeks; may ride as a passenger locally. Wear neck brace at all times except when showering or otherwise instructed. Diet Resume your normal diet.  Return to Work Will be discussed at you follow up appointment. Call Your Doctor If Any of These Occur Redness, drainage, or swelling at the wound.  Temperature greater than 101 degrees. Severe pain not relieved by pain medication. Increased difficulty swallowing.  Incision starts to come apart. Follow Up Appt Call today for appointment in 1-2 weeks [id number]) or for problems.  If you have any hardware placed in your spine, you will need an x-ray before your appointment.

## 2016-02-28 ENCOUNTER — Other Ambulatory Visit (INDEPENDENT_AMBULATORY_CARE_PROVIDER_SITE_OTHER): Payer: Self-pay | Admitting: Orthopaedic Surgery

## 2016-03-04 ENCOUNTER — Other Ambulatory Visit (INDEPENDENT_AMBULATORY_CARE_PROVIDER_SITE_OTHER): Payer: Self-pay

## 2016-03-04 ENCOUNTER — Other Ambulatory Visit (INDEPENDENT_AMBULATORY_CARE_PROVIDER_SITE_OTHER): Payer: Self-pay | Admitting: Physician Assistant

## 2016-03-04 MED ORDER — MELOXICAM 7.5 MG PO TABS: 7.5000 mg | ORAL_TABLET | Freq: Two times a day (BID) | ORAL | 0 refills | Status: AC

## 2016-03-05 NOTE — Telephone Encounter (Signed)
Please advise 

## 2016-03-27 ENCOUNTER — Other Ambulatory Visit (INDEPENDENT_AMBULATORY_CARE_PROVIDER_SITE_OTHER): Payer: Self-pay | Admitting: Orthopaedic Surgery

## 2016-03-30 NOTE — Telephone Encounter (Signed)
Please advise 

## 2016-07-13 ENCOUNTER — Other Ambulatory Visit: Payer: Self-pay | Admitting: Neurosurgery

## 2016-07-13 DIAGNOSIS — S129XXA Fracture of neck, unspecified, initial encounter: Secondary | ICD-10-CM

## 2016-07-15 ENCOUNTER — Ambulatory Visit
Admission: RE | Admit: 2016-07-15 | Discharge: 2016-07-15 | Disposition: A | Discharge status: Home / Self Care | Payer: 59 | Source: Ambulatory Visit | Attending: Neurosurgery | Admitting: Neurosurgery

## 2016-07-15 DIAGNOSIS — S129XXA Fracture of neck, unspecified, initial encounter: Secondary | ICD-10-CM

## 2016-09-24 ENCOUNTER — Other Ambulatory Visit: Payer: Self-pay | Admitting: Neurosurgery

## 2016-09-24 DIAGNOSIS — M25511 Pain in right shoulder: Secondary | ICD-10-CM

## 2016-10-01 ENCOUNTER — Ambulatory Visit
Admission: RE | Admit: 2016-10-01 | Discharge: 2016-10-01 | Disposition: A | Discharge status: Home / Self Care | Payer: 59 | Source: Ambulatory Visit | Attending: Neurosurgery | Admitting: Neurosurgery

## 2016-10-01 DIAGNOSIS — M25511 Pain in right shoulder: Secondary | ICD-10-CM

## 2016-12-07 ENCOUNTER — Other Ambulatory Visit (INDEPENDENT_AMBULATORY_CARE_PROVIDER_SITE_OTHER): Payer: Self-pay | Admitting: Physician Assistant

## 2016-12-07 NOTE — Telephone Encounter (Signed)
[facility] patient, can you advise?  

## 2017-01-17 ENCOUNTER — Other Ambulatory Visit (INDEPENDENT_AMBULATORY_CARE_PROVIDER_SITE_OTHER): Payer: Self-pay | Admitting: Physician Assistant

## 2017-01-18 NOTE — Telephone Encounter (Signed)
Please advise 

## 2017-02-01 ENCOUNTER — Other Ambulatory Visit (INDEPENDENT_AMBULATORY_CARE_PROVIDER_SITE_OTHER): Payer: Self-pay | Admitting: Orthopaedic Surgery

## 2017-02-25 ENCOUNTER — Other Ambulatory Visit (INDEPENDENT_AMBULATORY_CARE_PROVIDER_SITE_OTHER): Payer: Self-pay | Admitting: Physician Assistant

## 2017-02-25 NOTE — Telephone Encounter (Signed)
Please advise 

## 2017-03-03 ENCOUNTER — Other Ambulatory Visit (INDEPENDENT_AMBULATORY_CARE_PROVIDER_SITE_OTHER): Payer: Self-pay | Admitting: Orthopaedic Surgery

## 2017-03-31 ENCOUNTER — Other Ambulatory Visit (INDEPENDENT_AMBULATORY_CARE_PROVIDER_SITE_OTHER): Payer: Self-pay | Admitting: Physician Assistant

## 2017-03-31 NOTE — Telephone Encounter (Signed)
Please advise 

## 2017-04-23 ENCOUNTER — Other Ambulatory Visit (INDEPENDENT_AMBULATORY_CARE_PROVIDER_SITE_OTHER): Payer: Self-pay | Admitting: Physician Assistant

## 2017-04-23 NOTE — Telephone Encounter (Signed)
Ok to rf? 

## 2017-05-25 ENCOUNTER — Other Ambulatory Visit (INDEPENDENT_AMBULATORY_CARE_PROVIDER_SITE_OTHER): Payer: Self-pay | Admitting: Orthopaedic Surgery

## 2017-05-25 NOTE — Telephone Encounter (Signed)
Please advise 

## 2017-06-27 ENCOUNTER — Other Ambulatory Visit (INDEPENDENT_AMBULATORY_CARE_PROVIDER_SITE_OTHER): Payer: Self-pay | Admitting: Orthopaedic Surgery

## 2017-06-28 NOTE — Telephone Encounter (Signed)
Please advise 

## 2017-08-06 ENCOUNTER — Other Ambulatory Visit (INDEPENDENT_AMBULATORY_CARE_PROVIDER_SITE_OTHER): Payer: Self-pay | Admitting: Orthopaedic Surgery

## 2017-08-06 NOTE — Telephone Encounter (Signed)
Please advise 

## 2017-08-31 ENCOUNTER — Other Ambulatory Visit (INDEPENDENT_AMBULATORY_CARE_PROVIDER_SITE_OTHER): Payer: Self-pay | Admitting: Orthopaedic Surgery

## 2017-08-31 NOTE — Telephone Encounter (Signed)
Please advise 

## 2017-09-27 ENCOUNTER — Other Ambulatory Visit (INDEPENDENT_AMBULATORY_CARE_PROVIDER_SITE_OTHER): Payer: Self-pay | Admitting: Orthopaedic Surgery

## 2017-09-28 NOTE — Telephone Encounter (Signed)
Please advise 

## 2017-10-02 ENCOUNTER — Emergency Department (HOSPITAL_COMMUNITY): Payer: 59

## 2017-10-02 ENCOUNTER — Other Ambulatory Visit: Payer: Self-pay

## 2017-10-02 ENCOUNTER — Encounter (HOSPITAL_COMMUNITY): Payer: Self-pay | Admitting: Emergency Medicine

## 2017-10-02 ENCOUNTER — Emergency Department (HOSPITAL_COMMUNITY)
Admission: EM | Admit: 2017-10-02 | Discharge: 2017-10-03 | Disposition: A | Discharge status: Home / Self Care | Payer: 59 | Attending: Emergency Medicine | Admitting: Emergency Medicine

## 2017-10-02 DIAGNOSIS — Z91040 Latex allergy status: Secondary | ICD-10-CM | POA: Insufficient documentation

## 2017-10-02 DIAGNOSIS — F17210 Nicotine dependence, cigarettes, uncomplicated: Secondary | ICD-10-CM | POA: Insufficient documentation

## 2017-10-02 DIAGNOSIS — R0789 Other chest pain: Secondary | ICD-10-CM | POA: Insufficient documentation

## 2017-10-02 DIAGNOSIS — Z79899 Other long term (current) drug therapy: Secondary | ICD-10-CM | POA: Diagnosis not present

## 2017-10-02 LAB — CBC
HEMATOCRIT: 39.3 % (ref 36.0–46.0)
Hemoglobin: 12.7 g/dL (ref 12.0–15.0)
MCH: 28.6 pg (ref 26.0–34.0)
MCHC: 32.3 g/dL (ref 30.0–36.0)
MCV: 88.5 fL (ref 78.0–100.0)
Platelets: 257 10*3/uL (ref 150–400)
RBC: 4.44 MIL/uL (ref 3.87–5.11)
RDW: 12.5 % (ref 11.5–15.5)
WBC: 9.2 10*3/uL (ref 4.0–10.5)

## 2017-10-02 LAB — BASIC METABOLIC PANEL
ANION GAP: 8 (ref 5–15)
BUN: 12 mg/dL (ref 6–20)
CHLORIDE: 107 mmol/L (ref 101–111)
CO2: 23 mmol/L (ref 22–32)
Calcium: 9.3 mg/dL (ref 8.9–10.3)
Creatinine, Ser: 0.98 mg/dL (ref 0.44–1.00)
Glucose, Bld: 123 mg/dL — ABNORMAL HIGH (ref 65–99)
POTASSIUM: 3.8 mmol/L (ref 3.5–5.1)
Sodium: 138 mmol/L (ref 135–145)

## 2017-10-02 LAB — I-STAT TROPONIN, ED: Troponin i, poc: 0 ng/mL (ref 0.00–0.08)

## 2017-10-02 LAB — I-STAT BETA HCG BLOOD, ED (MC, WL, AP ONLY)

## 2017-10-02 NOTE — ED Triage Notes (Signed)
Reports sudden onset of left sided chest pain that radiates into the back and the left arm.  Endorses some sob, nausea, and weakness.  Describes pain as a squeeze.  No cardiac history.

## 2017-10-03 LAB — D-DIMER, QUANTITATIVE: D-Dimer, Quant: <0.27 ug/mL-FEU (ref 0.00–0.50)

## 2017-10-03 LAB — I-STAT TROPONIN, ED: Troponin i, poc: 0 ng/mL (ref 0.00–0.08)

## 2017-10-03 NOTE — ED Provider Notes (Signed)
[facility] Provider Note   CSN: [id number] Arrival date & time: 10/02/17  2238     History   Chief Complaint Chief Complaint  Patient presents with  . Chest Pain    HPI Bonnie Bailey is a 52 y.o. female.  Patient presents to the emergency department for evaluation of chest pain.  Patient reports that she had sudden onset of a sharp pain in the left upper chest that radiated through into her back tonight.  She says it felt like a charley horse.  It came on with maximal intensity and then eased off, reoccurred 2 or 3 times.  Initially it took her breath away, but she has now breathing comfortably.  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  She initially thought that it was due to her neck, but this is a pain that she has not experienced before so she came to be checked out.     Past Medical History:  Diagnosis Date  . Chronic neck pain    HNP  . Complication of anesthesia    hard to wake  . Heart murmur    as a baby. Medical Md occasionally hears it now  . History of bronchitis as a child   . Hypoglycemia   . Low back pain    DDD.scoliosis  . Muscle spasm    takes Zanaflex daily as needed  . Neuropathy    right side   . Osteoarthritis   . Weakness    numbness and tingling on left side    Patient Active Problem List   Diagnosis Date Noted  . Spondylosis of cervical spine 10/16/2015    Past Surgical History:  Procedure Laterality Date  . ANTERIOR CERVICAL DECOMP/DISCECTOMY FUSION N/A 10/16/2015   Procedure: Anterior cervical decompression fusion cervical four-five, cervical five-six, cervical six-seven;  Surgeon: Kary Kos, MD;  Location: [facility];  Service: Neurosurgery;  Laterality: N/A;  . CESAREAN SECTION  2000  . MOUTH SURGERY     multiple  . spine didn't fuse at birth      at birth  . TUBAL LIGATION       OB History   None      Home Medications    Prior to  Admission medications   Medication Sig Start Date End Date Taking? Authorizing Provider  cyclobenzaprine (FLEXERIL) 10 MG tablet Take 1 tablet (10 mg total) by mouth 3 (three) times daily as needed for muscle spasms. 10/17/15   Kary Kos, MD  Ergocalciferol (VITAMIN D2) 2000 units TABS Take 1 tablet by mouth daily.    [provider]  gabapentin (NEURONTIN) 300 MG capsule TAKE 1 CAPSULE BY MOUTH EVERY NIGHT AT BEDTIME 09/28/17   Mcarthur Rossetti, MD  Glucosamine-Chondroitin (MOVE FREE PO) Take 2 tablets by mouth daily.    [provider]  Lidocaine-Menthol 4-1 % CREA Apply 1 application topically 2 (two) times daily as needed (ICY HOT).    [provider]  Magnesium 250 MG TABS Take 1 tablet by mouth daily.    [provider]  meloxicam (MOBIC) 7.5 MG tablet TAKE 1 TABLET BY MOUTH TWICE DAILY 02/02/17   Mcarthur Rossetti, MD  Omega-3 Fatty Acids (FISH OIL) 1000 MG CAPS Take 2 capsules by mouth daily.    [provider]  oxyCODONE-acetaminophen (PERCOCET/ROXICET) 5-325 MG tablet Take 1-2 tablets by mouth every 4 (four) hours as needed for moderate pain. 10/17/15  Kary Kos, MD  Pyridoxine HCl (VITAMIN B-6) 250 MG tablet Take 250 mg by mouth daily.    [provider]  tiZANidine (ZANAFLEX) 4 MG tablet Take 4 mg by mouth 2 (two) times daily.    [provider]  vitamin E 400 UNIT capsule Take 400 Units by mouth daily.    [provider]    Family History No family history on file.  Social History Social History   Tobacco Use  . Smoking status: Current Every Day Smoker    Packs/day: 0.50    Years: 20.00    Pack years: 10.00    Types: Cigarettes  . Smokeless tobacco: Never Used  Substance Use Topics  . Alcohol use: No  . Drug use: No     Allergies   Penicillins; Sulfa antibiotics; Iodine; Latex; and Adhesive [tape]   Review of Systems Review of Systems  Cardiovascular: Positive for chest pain.    All other systems reviewed and are negative.    Physical Exam Updated Vital Signs BP 132/72 (BP Location: Right Arm)   Pulse 66   Temp 97.9 F (36.6 C) (Oral)   Resp 15   Ht 5\' 8"  (1.727 m)   Wt 88.5 kg (195 lb)   SpO2 100%   BMI 29.65 kg/m   Physical Exam  Constitutional: She is oriented to person, place, and time. She appears well-developed and well-nourished. No distress.  HENT:  Head: Normocephalic and atraumatic.  Right Ear: Hearing normal.  Left Ear: Hearing normal.  Nose: Nose normal.  Mouth/Throat: Oropharynx is clear and moist and mucous membranes are normal.  Eyes: Pupils are equal, round, and reactive to light. Conjunctivae and EOM are normal.  Neck: Normal range of motion. Neck supple.  Cardiovascular: Regular rhythm, S1 normal and S2 normal. Exam reveals no gallop and no friction rub.  No murmur heard. Pulmonary/Chest: Effort normal and breath sounds normal. No respiratory distress. She exhibits tenderness.    Abdominal: Soft. Normal appearance and bowel sounds are normal. There is no hepatosplenomegaly. There is no tenderness. There is no rebound, no guarding, no tenderness at McBurney's point and negative Murphy's sign. No hernia.  Musculoskeletal: Normal range of motion.  Neurological: She is alert and oriented to person, place, and time. She has normal strength. No cranial nerve deficit or sensory deficit. Coordination normal. GCS eye subscore is 4. GCS verbal subscore is 5. GCS motor subscore is 6.  Skin: Skin is warm, dry and intact. No rash noted. No cyanosis.  Psychiatric: She has a normal mood and affect. Her speech is normal and behavior is normal. Thought content normal.  Nursing note and vitals reviewed.    ED Treatments / Results  Labs (all labs ordered are listed, but only abnormal results are displayed) Labs Reviewed  BASIC METABOLIC PANEL - Abnormal; Notable for the following components:      Result Value   Glucose, Bld 123 (*)    All  other components within normal limits  CBC  D-DIMER, QUANTITATIVE (NOT AT [facility])  I-STAT TROPONIN, ED  I-STAT BETA HCG BLOOD, ED ([facility], [facility], [facility] ONLY)  I-STAT TROPONIN, ED    EKG EKG Interpretation  Date/Time:  Saturday October 02 2017 22:44:41 EDT Ventricular Rate:  76 PR Interval:  164 QRS Duration: 78 QT Interval:  390 QTC Calculation: 438 R Axis:   34 Text Interpretation:  Normal sinus rhythm Low voltage QRS Borderline ECG No old tracing to compare Confirmed by Delora Fuel ([id number]) on 10/03/2017 1:23:42 AM  Radiology Dg Chest 2 View  Result Date: 10/02/2017 CLINICAL DATA:  Acute onset of left-sided chest pain, radiating to the back and left arm. Shortness of breath, nausea and generalized weakness. EXAM: CHEST - 2 VIEW COMPARISON:  None. FINDINGS: The lungs are well-aerated and clear. There is no evidence of focal opacification, pleural effusion or pneumothorax. The heart is normal in size; the mediastinal contour is within normal limits. No acute osseous abnormalities are seen. The cervical spinal fusion hardware is partially imaged. IMPRESSION: No acute cardiopulmonary process seen. Electronically Signed   By: Garald Balding M.D.   On: 10/02/2017 23:12    Procedures Procedures (including critical care time)  Medications Ordered in ED Medications - No data to display   Initial Impression / Assessment and Plan / ED Course  I have reviewed the triage vital signs and the nursing notes.  Pertinent labs & imaging results that were available during my care of the patient were reviewed by me and considered in my medical decision making (see chart for details).     Patient presents to the emergency department for evaluation of chest pain.  She does not have any cardiac risk factors other than smoking history.  No first-degree relatives with heart disease.  Symptoms are very atypical for cardiac etiology.  EKG does not suggest ischemia or infarct, troponin negative x2.  Patient did have  some shortness of breath when the pain was at its maximum, but no longer short of breath.  She has not tachycardic or tachypneic.  Oxygen saturation 100%.  D-dimer is negative.  She is felt to be low risk for PE and this is adequate to rule out.  Mediastinum is normal on chest x-ray, symptoms do not raise suspicion for aortic dissection.  Patient reassured, will discharge with analgesia and rest.  She has a follow-up with primary doctor, return if symptoms worsen.  Final Clinical Impressions(s) / ED Diagnoses   Final diagnoses:  Chest wall pain    ED Discharge Orders    None       Orpah Greek, MD 10/03/17 [phone]

## 2017-10-03 NOTE — ED Notes (Signed)
Unable to obtain e-signature d/t equipment malfunction.  Pt verbalized understanding of d/c instructions, follow up and return precautions.

## 2017-11-08 ENCOUNTER — Other Ambulatory Visit (INDEPENDENT_AMBULATORY_CARE_PROVIDER_SITE_OTHER): Payer: Self-pay | Admitting: Orthopaedic Surgery

## 2017-11-08 NOTE — Telephone Encounter (Signed)
Please advise 

## 2017-12-13 ENCOUNTER — Other Ambulatory Visit (INDEPENDENT_AMBULATORY_CARE_PROVIDER_SITE_OTHER): Payer: Self-pay | Admitting: Orthopaedic Surgery

## 2017-12-13 NOTE — Telephone Encounter (Signed)
Please advise 

## 2018-01-11 ENCOUNTER — Other Ambulatory Visit (INDEPENDENT_AMBULATORY_CARE_PROVIDER_SITE_OTHER): Payer: Self-pay | Admitting: Physician Assistant

## 2018-01-11 NOTE — Telephone Encounter (Signed)
Please advise 

## 2019-07-18 ENCOUNTER — Encounter: Payer: Self-pay | Admitting: Gastroenterology

## 2019-08-16 ENCOUNTER — Telehealth: Payer: Self-pay

## 2019-08-16 NOTE — Telephone Encounter (Signed)
In chart work up it was noted pt has h/o malignant hyperthermia.  Also noted in dx list difficult to intubate.  Please review for appropriateness for [facility] or [facility]   Thank you

## 2019-08-16 NOTE — Telephone Encounter (Signed)
Please disregard this TE.  It was a misread.

## 2019-08-21 ENCOUNTER — Other Ambulatory Visit: Payer: Self-pay

## 2019-08-21 ENCOUNTER — Ambulatory Visit (AMBULATORY_SURGERY_CENTER): Payer: Self-pay

## 2019-08-21 VITALS — Temp 97.6°F | Ht 69.0 in | Wt 234.0 lb

## 2019-08-21 DIAGNOSIS — Z01818 Encounter for other preprocedural examination: Secondary | ICD-10-CM

## 2019-08-21 DIAGNOSIS — Z1211 Encounter for screening for malignant neoplasm of colon: Secondary | ICD-10-CM

## 2019-08-21 MED ORDER — NA SULFATE-K SULFATE-MG SULF 17.5-3.13-1.6 GM/177ML PO SOLN: 1.0000 | Freq: Once | ORAL | 0 refills | Status: AC

## 2019-08-21 NOTE — Progress Notes (Signed)
No allergies to soy or egg Pt is not on blood thinners or diet pills Denies issues with sedation/intubation Denies atrial flutter/fib Denies constipation   Emmi instructions given to pt  Pt is aware of Covid safety and care partner requirements.  

## 2019-08-31 ENCOUNTER — Other Ambulatory Visit: Payer: Self-pay | Admitting: Gastroenterology

## 2019-08-31 ENCOUNTER — Encounter: Payer: Self-pay | Admitting: Gastroenterology

## 2019-08-31 ENCOUNTER — Ambulatory Visit (INDEPENDENT_AMBULATORY_CARE_PROVIDER_SITE_OTHER): Payer: 59

## 2019-08-31 DIAGNOSIS — Z1159 Encounter for screening for other viral diseases: Secondary | ICD-10-CM

## 2019-09-01 LAB — SARS CORONAVIRUS 2 (TAT 6-24 HRS): SARS Coronavirus 2: NEGATIVE

## 2019-09-04 ENCOUNTER — Encounter: Payer: Self-pay | Admitting: Gastroenterology

## 2019-09-04 ENCOUNTER — Ambulatory Visit (AMBULATORY_SURGERY_CENTER): Payer: 59 | Admitting: Gastroenterology

## 2019-09-04 ENCOUNTER — Other Ambulatory Visit: Payer: Self-pay

## 2019-09-04 VITALS — BP 106/64 | HR 59 | Temp 97.5°F | Resp 21 | Ht 69.0 in | Wt 234.0 lb

## 2019-09-04 DIAGNOSIS — D120 Benign neoplasm of cecum: Secondary | ICD-10-CM

## 2019-09-04 DIAGNOSIS — D128 Benign neoplasm of rectum: Secondary | ICD-10-CM

## 2019-09-04 DIAGNOSIS — D127 Benign neoplasm of rectosigmoid junction: Secondary | ICD-10-CM | POA: Diagnosis not present

## 2019-09-04 DIAGNOSIS — D123 Benign neoplasm of transverse colon: Secondary | ICD-10-CM | POA: Diagnosis not present

## 2019-09-04 DIAGNOSIS — Z1211 Encounter for screening for malignant neoplasm of colon: Secondary | ICD-10-CM | POA: Diagnosis present

## 2019-09-04 DIAGNOSIS — D125 Benign neoplasm of sigmoid colon: Secondary | ICD-10-CM

## 2019-09-04 MED ORDER — SODIUM CHLORIDE 0.9 % IV SOLN: 500.0000 mL | Freq: Once | INTRAVENOUS | Status: DC

## 2019-09-04 NOTE — Progress Notes (Signed)
Pt's states no medical or surgical changes since previsit or office visit. 

## 2019-09-04 NOTE — Progress Notes (Signed)
No problems noted in the recovery room. maw 

## 2019-09-04 NOTE — Progress Notes (Signed)
Called to room to assist during endoscopic procedure.  Patient ID and intended procedure confirmed with present staff. Received instructions for my participation in the procedure from the performing physician.  

## 2019-09-04 NOTE — Patient Instructions (Addendum)
YOU HAD AN ENDOSCOPIC PROCEDURE TODAY AT [facility]:   Refer to the procedure report that was given to you for any specific questions about what was found during the examination.  If the procedure report does not answer your questions, please call your gastroenterologist to clarify.  If you requested that your care partner not be given the details of your procedure findings, then the procedure report has been included in a sealed envelope for you to review at your convenience later.  YOU SHOULD EXPECT: Some feelings of bloating in the abdomen. Passage of more gas than usual.  Walking can help get rid of the air that was put into your GI tract during the procedure and reduce the bloating. If you had a lower endoscopy (such as a colonoscopy or flexible sigmoidoscopy) you may notice spotting of blood in your stool or on the toilet paper. If you underwent a bowel prep for your procedure, you may not have a normal bowel movement for a few days.  Please Note:  You might notice some irritation and congestion in your nose or some drainage.  This is from the oxygen used during your procedure.  There is no need for concern and it should clear up in a day or so.  SYMPTOMS TO REPORT IMMEDIATELY:   Following lower endoscopy (colonoscopy or flexible sigmoidoscopy):  Excessive amounts of blood in the stool  Significant tenderness or worsening of abdominal pains  Swelling of the abdomen that is new, acute  Fever of 100F or higher   For urgent or emergent issues, a gastroenterologist can be reached at any hour by calling [phone]. Do not use MyChart messaging for urgent concerns.    DIET:  We do recommend a small meal at first, but then you may proceed to your regular diet.  Drink plenty of fluids but you should avoid alcoholic beverages for 24 hours.  ACTIVITY:  You should plan to take it easy for the rest of today and you should NOT DRIVE or use heavy machinery until tomorrow (because  of the sedation medicines used during the test).    FOLLOW UP: Our staff will call the number listed on your records 48-72 hours following your procedure to check on you and address any questions or concerns that you may have regarding the information given to you following your procedure. If we do not reach you, we will leave a message.  We will attempt to reach you two times.  During this call, we will ask if you have developed any symptoms of COVID 19. If you develop any symptoms (ie: fever, flu-like symptoms, shortness of breath, cough etc.) before then, please call [phone].  If you test positive for Covid 19 in the 2 weeks post procedure, please call and report this information to [place].    If any biopsies were taken you will be contacted by phone or by letter within the next 1-3 weeks.  Please call us at [phone] if you have not heard about the biopsies in 3 weeks.    SIGNATURES/CONFIDENTIALITY: You and/or your care partner have signed paperwork which will be entered into your electronic medical record.  These signatures attest to the fact that that the information above on your After Visit Summary has been reviewed and is understood.  Full responsibility of the confidentiality of this discharge information lies with you and/or your care-partner.    Handouts were given to your care partner on polyps, diverticulosis, and hemorrhoids. Aspirin 81 mg  is okay to continue daily but hold aspirin 325 mg and all anti-inflammatory meds for 2 weeks. You may resume your other current medications today. Await biopsy results. Please call if any questions or concerns.

## 2019-09-04 NOTE — Op Note (Signed)
[facility] Patient Name: Bonnie Bailey Procedure Date: 09/04/2019 9:04 AM MRN: [id number] Endoscopist: Mauri Pole , MD Age: 54 Referring MD:  Date of Birth: 12-24-1965 Gender: Female Account #: [id number] Procedure:                Colonoscopy Indications:              Screening for colorectal malignant neoplasm Medicines:                Monitored Anesthesia Care Procedure:                Pre-Anesthesia Assessment:                           - Prior to the procedure, a History and Physical                            was performed, and patient medications and                            allergies were reviewed. The patient's tolerance of                            previous anesthesia was also reviewed. The risks                            and benefits of the procedure and the sedation                            options and risks were discussed with the patient.                            All questions were answered, and informed consent                            was obtained. Prior Anticoagulants: The patient has                            taken no previous anticoagulant or antiplatelet                            agents. ASA Grade Assessment: II - A patient with                            mild systemic disease. After reviewing the risks                            and benefits, the patient was deemed in                            satisfactory condition to undergo the procedure.                           After obtaining informed consent, the colonoscope  was passed under direct vision. Throughout the                            procedure, the patient's blood pressure, pulse, and                            oxygen saturations were monitored continuously. The                            Colonoscope was introduced through the anus and                            advanced to the the cecum, identified by                            appendiceal orifice and  ileocecal valve. The                            colonoscopy was performed without difficulty. The                            patient tolerated the procedure well. The quality                            of the bowel preparation was good. The ileocecal                            valve, appendiceal orifice, and rectum were                            photographed. Scope In: 9:12:38 AM Scope Out: 9:33:18 AM Scope Withdrawal Time: 0 hours 16 minutes 8 seconds  Total Procedure Duration: 0 hours 20 minutes 40 seconds  Findings:                 The perianal and digital rectal examinations were                            normal.                           Five sessile polyps were found in the rectum,                            sigmoid colon, transverse colon and cecum. The                            polyps were 5 to 11 mm in size. These polyps were                            removed with a cold snare. Resection and retrieval                            were complete.  A 16 mm polyp was found in the recto-sigmoid colon.                            The polyp was pedunculated. The polyp was removed                            with a hot snare. Resection and retrieval were                            complete.                           Scattered small-mouthed diverticula were found in                            the sigmoid colon and descending colon.                           Non-bleeding internal hemorrhoids were found during                            retroflexion. The hemorrhoids were small. Complications:            No immediate complications. Estimated Blood Loss:     Estimated blood loss was minimal. Impression:               - Five 5 to 11 mm polyps in the rectum, in the                            sigmoid colon, in the transverse colon and in the                            cecum, removed with a cold snare. Resected and                            retrieved.                            - One 16 mm polyp at the recto-sigmoid colon,                            removed with a hot snare. Resected and retrieved.                           - Diverticulosis in the sigmoid colon and in the                            descending colon.                           - Non-bleeding internal hemorrhoids. Recommendation:           - Patient has a contact number available for  emergencies. The signs and symptoms of potential                            delayed complications were discussed with the                            patient. Return to normal activities tomorrow.                            Written discharge instructions were provided to the                            patient.                           - Resume previous diet.                           - Continue present medications.                           - Await pathology results.                           - Repeat colonoscopy in 3 years for surveillance                            based on pathology results. Mauri Pole, MD 09/04/2019 9:37:31 AM This report has been signed electronically.

## 2019-09-04 NOTE — Progress Notes (Signed)
[place]

## 2019-09-04 NOTE — Progress Notes (Signed)
A/ox3, pleased with MAC, report to RN 

## 2019-09-06 ENCOUNTER — Telehealth: Payer: Self-pay | Admitting: *Deleted

## 2019-09-06 NOTE — Telephone Encounter (Signed)
  Follow up Call-  Call back number 09/04/2019  Post procedure Call Back phone  # [phone]  Permission to leave phone message Yes  Some recent data might be hidden     Patient questions:  Do you have a fever, pain , or abdominal swelling? No. Pain Score  0 *  Have you tolerated food without any problems? Yes.    Have you been able to return to your normal activities? Yes.    Do you have any questions about your discharge instructions: Diet   No. Medications  No. Follow up visit  No.  Do you have questions or concerns about your Care? No.  Actions: * If pain score is 4 or above: No action needed, pain <4.  1. Have you developed a fever since your procedure? no  2.   Have you had an respiratory symptoms (SOB or cough) since your procedure? no  3.   Have you tested positive for COVID 19 since your procedure no  4.   Have you had any family members/close contacts diagnosed with the COVID 19 since your procedure?  no   If yes to any of these questions please route to Joylene John, RN and Erenest Rasher, RN

## 2019-09-07 ENCOUNTER — Encounter: Payer: Self-pay | Admitting: Gastroenterology

## 2020-11-11 ENCOUNTER — Ambulatory Visit: Payer: Self-pay

## 2020-11-11 ENCOUNTER — Ambulatory Visit: Payer: 59 | Admitting: Orthopaedic Surgery

## 2020-11-11 DIAGNOSIS — M25511 Pain in right shoulder: Secondary | ICD-10-CM

## 2020-11-11 DIAGNOSIS — M25522 Pain in left elbow: Secondary | ICD-10-CM

## 2020-11-11 NOTE — Progress Notes (Signed)
Office Visit Note   Patient: Bonnie Bailey           Date of Birth: October 30, 1965           MRN: [id number] Visit Date: 11/11/2020              Requested by: Berkley Harvey, NP [place],  [place] [address] PCP: Berkley Harvey, NP   Assessment & Plan: Visit Diagnoses:  1. Acute pain of right shoulder   2. Pain in left elbow     Plan: Based on her clinical exam of her right shoulder I am quite concerned that she has a full-thickness tear of the rotator cuff.  Given her clinical exam findings, a MRI of the right shoulder is warranted to rule out a rotator cuff tear.  I will let her return to work in terms of just light duty work with no lifting greater than 5 pounds with either arm and no crawling around on the ground.  All question concerns were answered addressed.  We will see her in follow-up after we have a MRI of the right shoulder.  Follow-Up Instructions: No follow-ups on file.   Orders:  Orders Placed This Encounter  Procedures   XR Elbow 2 Views Left   XR Shoulder Right   No orders of the defined types were placed in this encounter.     Procedures: No procedures performed   Clinical Data: No additional findings.   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  She is having a lot of pain with weakness of that right shoulder and difficulty with her actives daily living such as getting in and out of closed and undergarments as well as washing her hair.  She cannot move her shoulder well.  She has been in a splint on her left elbow.  She went to an outside hospital.  Since she has been seen in this office before she stopped further evaluation treatment here.  She has taken occasional Robaxin and ibuprofen.  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  She has never injured her right shoulder or left elbow before.  She denies any numbness and tingling in her hands.  HPI  Review of Systems There is currently listed no headache, chest pain, shortness of breath, fever, chills, nausea, vomiting  Objective: Vital Signs: There were no vitals taken for this visit.  Physical Exam She is alert and orient x3 and in no acute distress Ortho Exam Examination of her left elbow shows some bruising around the elbow but no effusion.  The elbow is ligamentously stable with full range of motion.  There is some pain at the tip of the olecranon.  The right shoulder shows significant deficits in the rotator cuff with abduction being significantly weak as well as external rotation.  Internal rotation with abduction is limited as well.  She is using more of her deltoids to abduct her shoulder.  It is clinically located but definitely weak in terms of the rotator cuff. Specialty Comments:  No specialty comments available.  Imaging: XR Elbow 2 Views Left  Result Date: 11/11/2020 2 views of the left elbow  show well located elbow.  There is a small avulsion of an osteophyte at the tip of the olecranon.  XR Shoulder Right  Result Date: 11/11/2020 3 views of the right shoulder show no acute findings.  The shoulder is well located.    PMFS History: Patient Active Problem List   Diagnosis Date Noted   Spondylosis of cervical spine 10/16/2015   Past Medical History:  Diagnosis Date   Chronic neck pain    HNP   Complication of anesthesia    hard to wake   Heart murmur    as a baby. Medical Md occasionally hears it now   History of bronchitis as a child    Hypoglycemia    Low back pain    DDD.scoliosis   Muscle spasm    takes Zanaflex daily as needed   Neuromuscular disorder (HCC)    Neuropathy    right side    Osteoarthritis    Weakness    numbness and tingling on left side     Family History  Problem Relation Age of Onset   Colon polyps Sister 28   Colon cancer Neg Hx    Esophageal cancer Neg Hx    Rectal cancer Neg Hx    Stomach cancer Neg Hx     Past Surgical History:  Procedure Laterality Date   ANTERIOR CERVICAL DECOMP/DISCECTOMY FUSION N/A 10/16/2015   Procedure: Anterior cervical decompression fusion cervical four-five, cervical five-six, cervical six-seven;  Surgeon: Kary Kos, MD;  Location: [facility];  Service: Neurosurgery;  Laterality: N/A;   CESAREAN SECTION  2000   MOUTH SURGERY     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

## 2020-11-12 ENCOUNTER — Other Ambulatory Visit: Payer: Self-pay

## 2020-11-12 DIAGNOSIS — Z96611 Presence of right artificial shoulder joint: Secondary | ICD-10-CM

## 2020-11-18 ENCOUNTER — Ambulatory Visit
Admission: RE | Admit: 2020-11-18 | Discharge: 2020-11-18 | Disposition: A | Discharge status: Home / Self Care | Payer: 59 | Source: Ambulatory Visit | Attending: Orthopaedic Surgery | Admitting: Orthopaedic Surgery

## 2020-11-18 ENCOUNTER — Other Ambulatory Visit: Payer: Self-pay

## 2020-11-18 DIAGNOSIS — Z96611 Presence of right artificial shoulder joint: Secondary | ICD-10-CM

## 2020-11-21 ENCOUNTER — Encounter: Payer: Self-pay | Admitting: Orthopaedic Surgery

## 2020-11-21 ENCOUNTER — Ambulatory Visit: Payer: 59 | Admitting: Orthopaedic Surgery

## 2020-11-21 VITALS — Ht 69.0 in | Wt 234.0 lb

## 2020-11-21 DIAGNOSIS — M25511 Pain in right shoulder: Secondary | ICD-10-CM | POA: Diagnosis not present

## 2020-11-21 MED ORDER — METHYLPREDNISOLONE ACETATE 40 MG/ML IJ SUSP
40.0000 mg | INTRAMUSCULAR | Status: AC | PRN
  Administered 2020-11-21: 40 mg via INTRA_ARTICULAR

## 2020-11-21 MED ORDER — LIDOCAINE HCL 1 % IJ SOLN
3.0000 mL | INTRAMUSCULAR | Status: AC | PRN
  Administered 2020-11-21: 3 mL

## 2020-11-21 NOTE — Progress Notes (Signed)
Office Visit Note   Patient: Bonnie Bailey           Date of Birth: [birth year]           MRN: [id number] Visit Date: 11/21/2020              Requested by: Berkley Harvey, NP [place],  [place] [address] PCP: Berkley Harvey, NP   Assessment & Plan: Visit Diagnoses:  1. Acute pain of right shoulder     Plan: Fortunately her MRI does not show a rotator cuff tear of the right shoulder.  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  I did provide a steroid injection in the subacromial outlet right shoulder.  She is not a diabetic so would like to send her to Dr. Junius Roads next week for an ultrasound-guided steroid injection in the right shoulder glenohumeral joint.  I also gave her prescription for physical therapy for outpatient physical therapy to work on any modalities to get her right shoulder moving better.  All questions and concerns were answered addressed.  I will see her back myself in 4 weeks.  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  Follow-Up Instructions: Return in about 4 weeks (around 12/19/2020).   Orders:  Orders Placed This Encounter  Procedures   Large Joint Inj   No orders of the defined types were placed in this encounter.     Procedures: Large Joint Inj: R subacromial bursa on 11/21/2020 2:24 PM Indications: pain and diagnostic evaluation Details: 22 G 1.5 in needle  Arthrogram: No  Medications: 3 mL lidocaine 1 %; 40 mg methylPREDNISolone acetate 40 MG/ML Outcome: tolerated well, no immediate complications Procedure, treatment alternatives, risks and benefits explained, specific risks discussed. Consent was given by the patient. Immediately prior to procedure a time out was called to verify the correct patient, procedure, equipment, support staff and site/side marked as required. Patient was prepped and draped in the usual sterile fashion.      Clinical Data: No additional  findings.   Subjective: Chief Complaint  Patient presents with   Right Shoulder - Follow-up    MRI review  The patient comes in today to go over a MRI of the right shoulder.  She had a hard mechanical fall down some stairs recently.  She sustained a small avulsion fracture of the tip of her olecranon of the left elbow.  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  We saw her at her most recent visit there was definitely concern of a full-thickness rotator cuff tear based on the weakness with her right shoulder and the decreased motion.  She is here for review of the MRI today.  This MRI was definitely warranted based on her symptoms and the hard mechanical fall that she had had and she is only 55 years old.  She had never injured this shoulder before.  She is still having problems reaching overhead and behind her with her right shoulder and has had some right shoulder weakness.  HPI  Review of Systems There is currently listed no headache, chest pain, shortness of breath, fever, chills, nausea, vomiting  Objective: Vital Signs: Ht 5\' 9"  (1.753 m)   Wt 234 lb (106.1 kg)   BMI 34.56 kg/m   Physical Exam She is alert and orient x3 and in no acute distress Ortho Exam Examination of her right shoulder  shows weakness and decreased motion.  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 Specialty Comments:  No specialty comments available.  Imaging: No results found. Fortunately the MRI of the right shoulder does not show any type of tear of the muscle.  There is no atrophy.  There is no contusion of the shoulder can be seen in terms of edema.  The rotator cuff is intact with only mild tendinosis and mild bursitis.  The shoulder is well located.  There is no pathology of the glenohumeral joint.  PMFS History: Patient Active Problem List   Diagnosis Date Noted   Spondylosis of cervical spine 10/16/2015   Past Medical History:  Diagnosis  Date   Chronic neck pain    HNP   Complication of anesthesia    hard to wake   Heart murmur    as a baby. Medical Md occasionally hears it now   History of bronchitis as a child    Hypoglycemia    Low back pain    DDD.scoliosis   Muscle spasm    takes Zanaflex daily as needed   Neuromuscular disorder (HCC)    Neuropathy    right side    Osteoarthritis    Weakness    numbness and tingling on left side    Family History  Problem Relation Age of Onset   Colon polyps Sister 1   Colon cancer Neg Hx    Esophageal cancer Neg Hx    Rectal cancer Neg Hx    Stomach cancer Neg Hx     Past Surgical History:  Procedure Laterality Date   ANTERIOR CERVICAL DECOMP/DISCECTOMY FUSION N/A 10/16/2015   Procedure: Anterior cervical decompression fusion cervical four-five, cervical five-six, cervical six-seven;  Surgeon: Kary Kos, MD;  Location: [facility];  Service: Neurosurgery;  Laterality: N/A;   CESAREAN SECTION  2000   MOUTH SURGERY     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

## 2020-11-25 ENCOUNTER — Ambulatory Visit: Payer: Self-pay

## 2020-11-25 ENCOUNTER — Other Ambulatory Visit: Payer: Self-pay

## 2020-11-25 ENCOUNTER — Ambulatory Visit: Payer: 59 | Admitting: Family Medicine

## 2020-11-25 DIAGNOSIS — M25511 Pain in right shoulder: Secondary | ICD-10-CM

## 2020-11-25 NOTE — Progress Notes (Signed)
Subjective: Patient is here for ultrasound-guided intra-articular right glenohumeral injection.  Pain from adhesive capsulitis.  Objective:  Decreased ROM.  Procedure: Ultrasound guided injection is preferred based studies that show increased duration, increased effect, greater accuracy, decreased procedural pain, increased response rate, and decreased cost with ultrasound guided versus blind injection.   Verbal informed consent obtained.  Time-out conducted.  Noted no overlying erythema, induration, or other signs of local infection. Ultrasound-guided right glenohumeral injection: After sterile prep with Betadine, injected 4 cc 0.25% bupivocaine without epinephrine and 6 mg betamethasone using a 22-gauge spinal needle, passing the needle from posterior approach into the glenohumeral joint.  Injectate seen filling joint capsule.

## 2020-11-27 ENCOUNTER — Telehealth: Payer: Self-pay | Admitting: Orthopaedic Surgery

## 2020-11-27 NOTE — Telephone Encounter (Signed)
Notes faxed to [facility] [phone]

## 2020-12-19 ENCOUNTER — Ambulatory Visit: Payer: 59 | Admitting: Orthopaedic Surgery

## 2020-12-23 ENCOUNTER — Ambulatory Visit: Payer: 59 | Admitting: Orthopaedic Surgery

## 2020-12-23 ENCOUNTER — Encounter: Payer: Self-pay | Admitting: Orthopaedic Surgery

## 2020-12-23 DIAGNOSIS — M25511 Pain in right shoulder: Secondary | ICD-10-CM

## 2020-12-23 DIAGNOSIS — M7501 Adhesive capsulitis of right shoulder: Secondary | ICD-10-CM

## 2020-12-23 NOTE — Progress Notes (Signed)
The patient comes in today with continued follow-up as it relates to her right shoulder.  She had a hard mechanical fall in June down some steps injuring her right shoulder.  She then developed a frozen shoulder.  A MRI of the shoulder fortunately did not show a rotator cuff tear.  At her last visit we sent her to Dr. Junius Roads and he performed an intra-articular steroid injection under ultrasound right shoulder and she has been through aggressive physical therapy.  She is back to her regular work.  She does report a significant improvement with her motion and performing her ADLs but there is still significant limitations.  Anti-inflammatories do help but occasionally cause gastritis.  Exam of the right shoulder shows that her forward flexion and abduction is improved quite a bit.  Her external rotation is still limited and her internal rotation with adduction is still significant limited with reaching only to the lower lumbar spine.  We both agree that we should continue conservative treatment.  She is having some type of e-stim unit that she started use at home on her shoulder and she is going to continue aggressive therapy.  We will see her back in 4 weeks to see how she is doing overall.  I would likely recommend a repeat intra-articular steroid injection if needed at that point.

## 2021-01-20 ENCOUNTER — Encounter: Payer: Self-pay | Admitting: Orthopaedic Surgery

## 2021-01-20 ENCOUNTER — Ambulatory Visit: Payer: 59 | Admitting: Orthopaedic Surgery

## 2021-01-20 ENCOUNTER — Other Ambulatory Visit: Payer: Self-pay

## 2021-01-20 DIAGNOSIS — M25511 Pain in right shoulder: Secondary | ICD-10-CM

## 2021-01-20 DIAGNOSIS — M7501 Adhesive capsulitis of right shoulder: Secondary | ICD-10-CM | POA: Diagnosis not present

## 2021-01-20 NOTE — Progress Notes (Signed)
The patient is continue to follow-up as it relates to her right frozen shoulder.  With the help of physical therapy and her pushing her self she has been able to significantly improve the range of motion of the right shoulder.  She is still limited with her internal rotation with adduction but reaching behind her has improved in order to just perform daily hygiene.  On exam her external rotation is almost full and her forward flexion and abduction is almost full of the right shoulder.  She still has limitations with reaching behind her but this is improved greatly.  At this point follow-up can be as needed since she continues to make improvements.  I agree with her continuing therapy for potentially another 4 to 6 weeks.  I have told her to give [place] a call to consider another intra-articular injection in the right shoulder joint under fluoroscopy with Dr. Ernestina Patches in the months from now if she wants this to be done.  Right now, I do think surgery is indicated since she is doing so much better.  All questions and concerns were answered and addressed.

## 2022-12-05 IMAGING — MR MR SHOULDER*R* W/O CM
6 series · 40 of 40 positions shown · non-contrast
Comparison: MRI right shoulder 10/01/2016. Plain films right
shoulder 11/11/2020.

CLINICAL DATA: Right shoulder pain.  No known injury.

EXAM:
MRI OF THE RIGHT SHOULDER WITHOUT CONTRAST
TECHNIQUE: Multiplanar, multisequence MR imaging of the shoulder was performed.
No intravenous contrast was administered.

[Series 3: T2 fat-sat · axial · 4.0mm · 0.59mm/px · z∈[-36,+78]mm · 7 of 26 slices shown (1 of 4)]
[im 1/26]
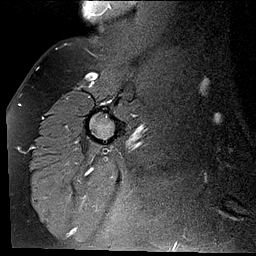
[im 5/26]
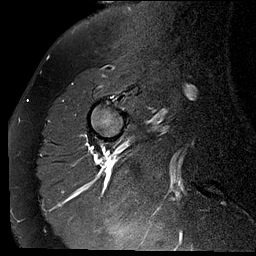
[im 9/26]
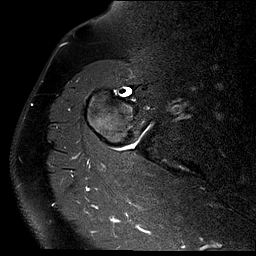
[im 13/26]
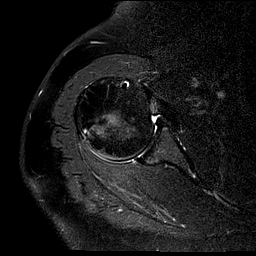
[im 17/26]
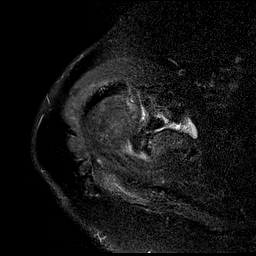
[im 21/26]
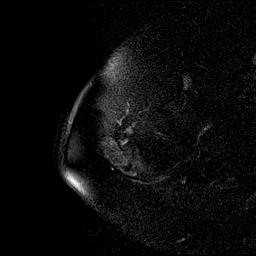
[im 26/26]
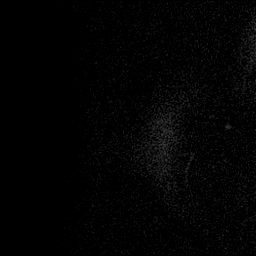

[Series 4: T2 fat-sat · oblique · 4.0mm · 0.59mm/px · 6 of 21 slices shown (2 of 4)]
[im 1/21]
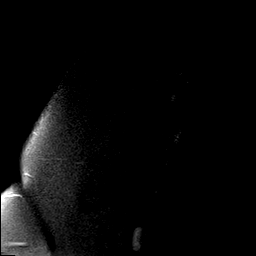
[im 5/21]
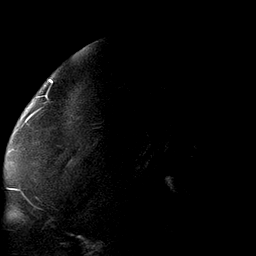
[im 9/21]
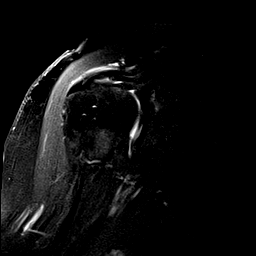
[im 13/21]
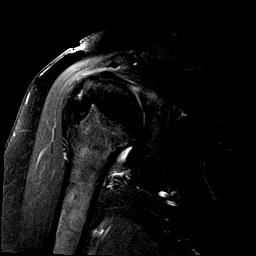
[im 17/21]
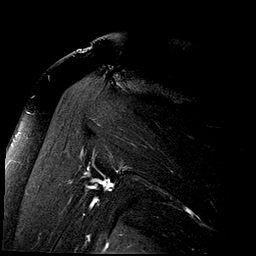
[im 21/21]
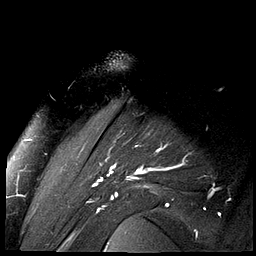

[Series 5: PD · oblique · 4.0mm · 0.29mm/px · 6 of 21 slices shown]
[im 1/21]
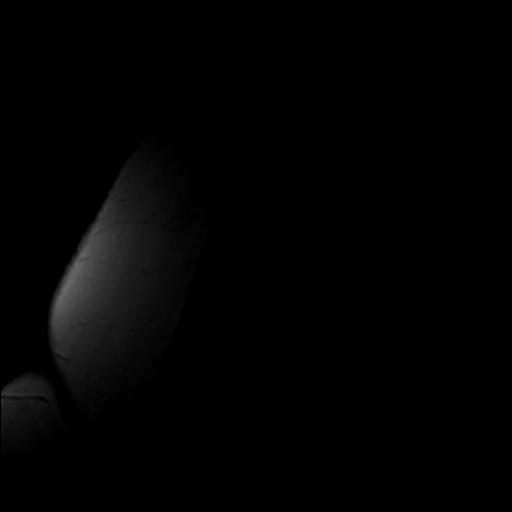
[im 5/21]
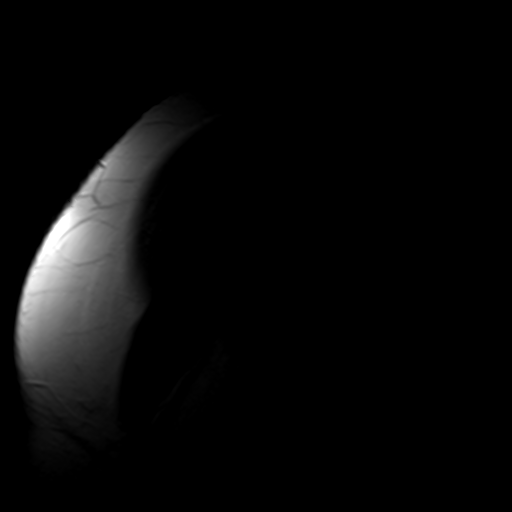
[im 9/21]
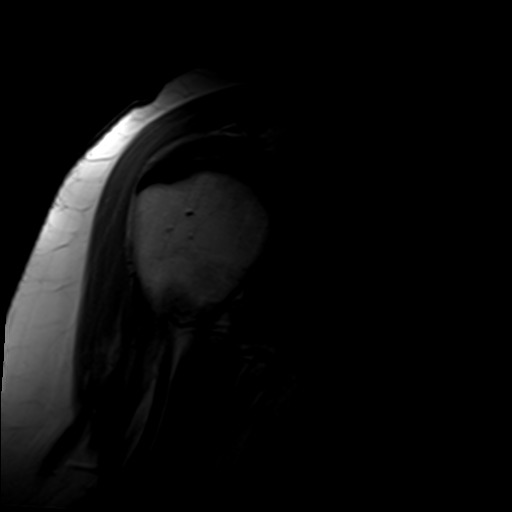
[im 13/21]
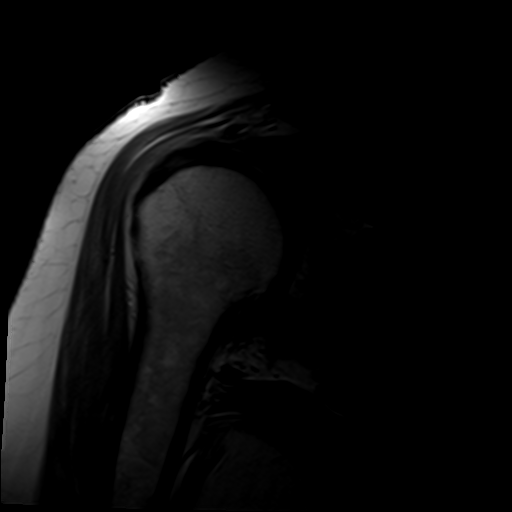
[im 17/21]
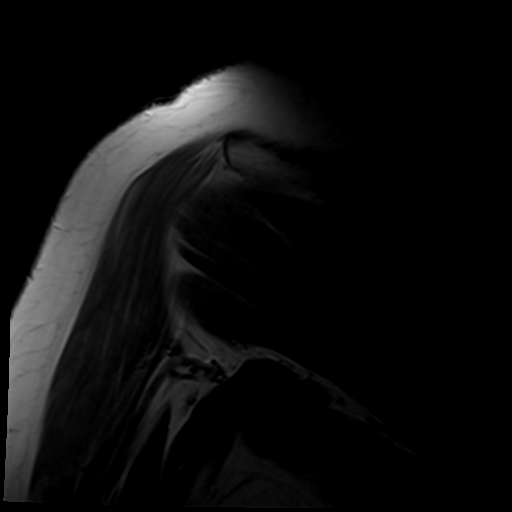
[im 21/21]
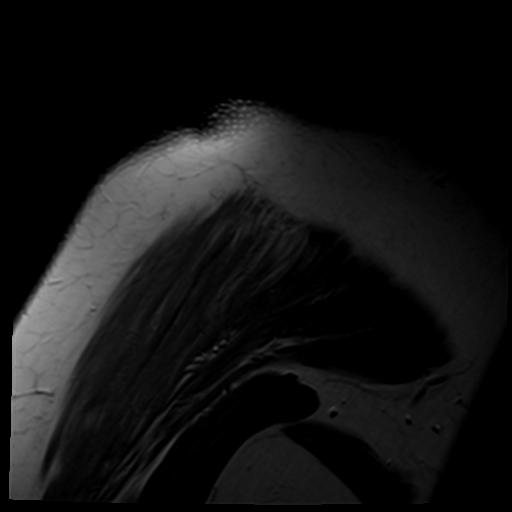

[Series 6: T2 fat-sat · coronal · 4.0mm · 0.59mm/px · 7 of 25 slices shown (3 of 4)]
[im 1/25]
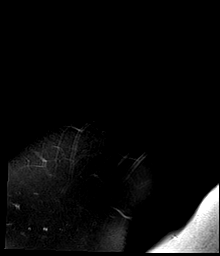
[im 5/25]
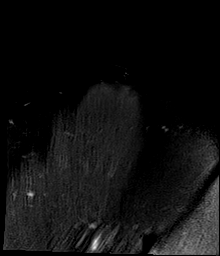
[im 9/25]
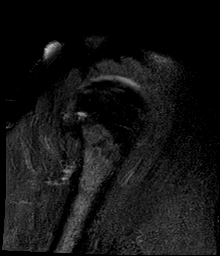
[im 13/25]
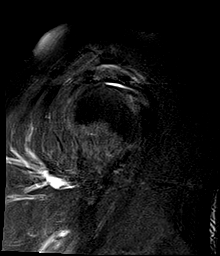
[im 17/25]
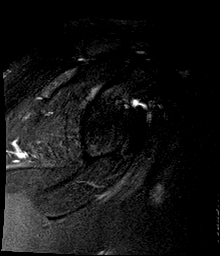
[im 21/25]
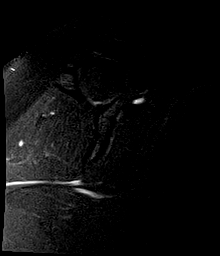
[im 25/25]
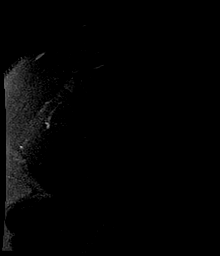

[Series 7: T1 · coronal · 4.0mm · 0.29mm/px · 7 of 25 slices shown]
[im 1/25]
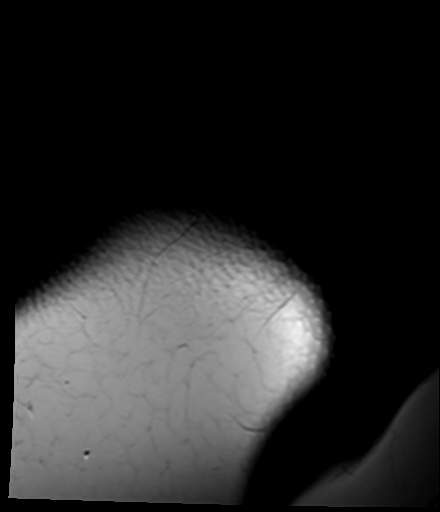
[im 5/25]
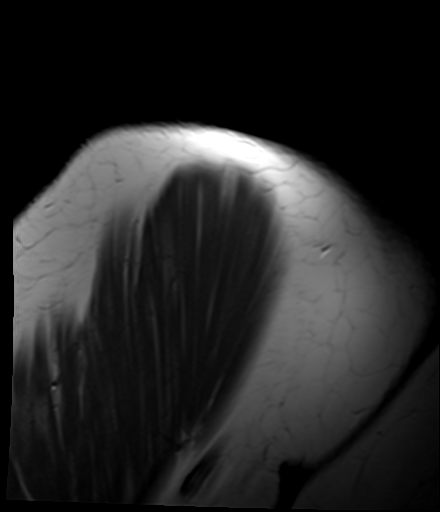
[im 9/25]
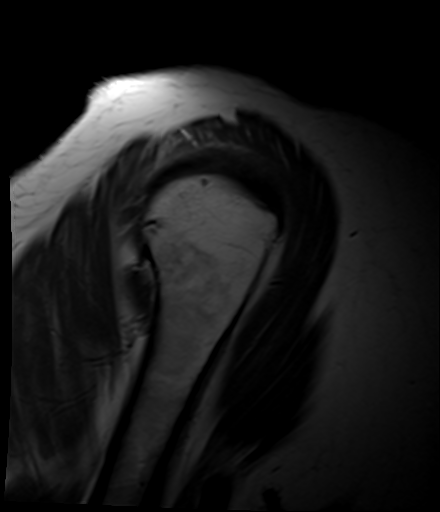
[im 13/25]
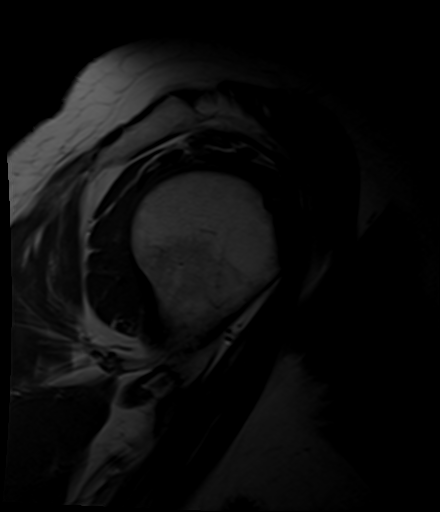
[im 17/25]
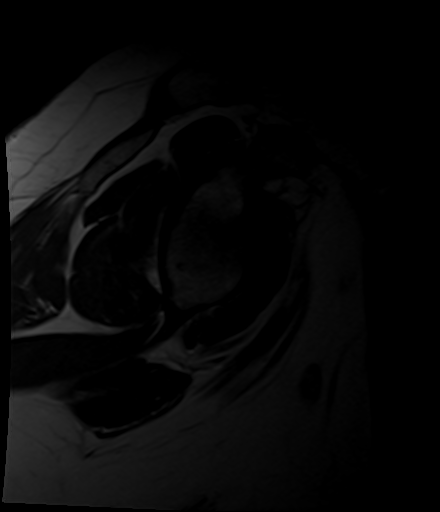
[im 21/25]
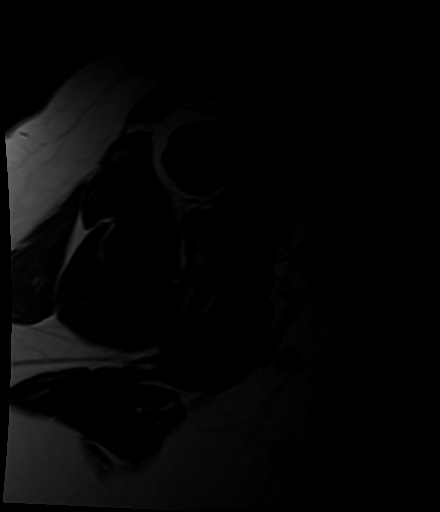
[im 25/25]
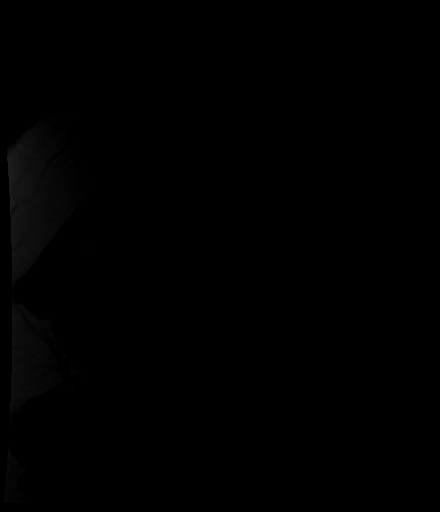

[Series 8: T2 fat-sat · coronal · 4.0mm · 0.59mm/px · 7 of 25 slices shown (4 of 4)]
[im 1/25]
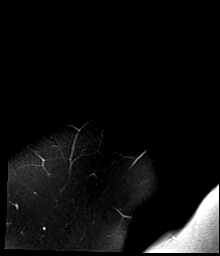
[im 5/25]
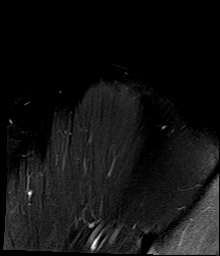
[im 9/25]
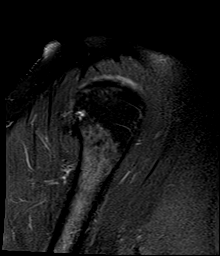
[im 13/25]
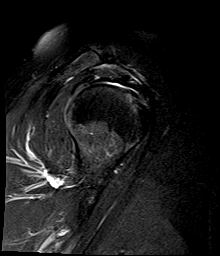
[im 17/25]
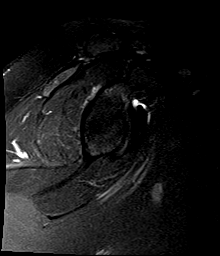
[im 21/25]
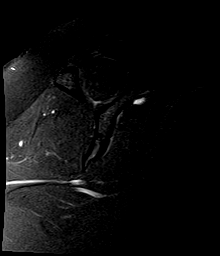
[im 25/25]
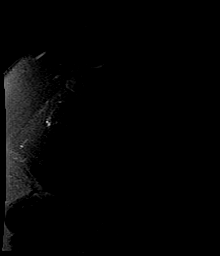

[40 of 40 positions shown; findings below may reference images not displayed]

FINDINGS: Rotator cuff: Intact. Mild supraspinatus and infraspinatus
tendinopathy noted.

Muscles:  Normal.  No atrophy or focal lesion.

Biceps long head:  Intact and normal in appearance.

Acromioclavicular Joint: Normal. Type 1 acromion. A small volume of
fluid is seen in the subacromial/subdeltoid bursa.

Glenohumeral Joint: Appears normal.

Labrum:  The superior labrum appears degenerated and frayed.

Bones:  Negative.

Other: None.
IMPRESSION: Mild supraspinatus and infraspinatus tendinopathy without tear.

Degenerated and frayed superior labrum.

Small volume of subacromial/subdeltoid fluid compatible with
bursitis.

## 2023-05-30 ENCOUNTER — Encounter: Payer: Self-pay | Admitting: Gastroenterology

## 2024-06-01 NOTE — Progress Notes (Unsigned)
" °  Cardiology Office Note   Date:  06/01/2024  ID:  Bonnie Bailey, DOB 12/31/1965, MRN [id number] PCP: Joshua Santana CROME, NP  [facility] Providers Cardiologist:  None { Click to update primary MD,subspecialty MD or APP then REFRESH:1}    History of Present Illness Bonnie Bailey is a 59 y.o. female with a past medical history of prediabetes, spondylosis of cervical spine. Presents today for evaluation of palpitations   Patient was seen by their PCP on 05/23/24 for her annual exam. Reported palpitations. TSH within normal limits. Referred to cardiology   Palpitations  -  - Lab work through PCP in 05/2024 showed normal TSH, mag, potassium, hemoglobin   HLD  - Lipid panel from 05/2024 showed LDL 140, HDL 66, triglycerides 105  -   ROS: ***  Studies Reviewed      *** Risk Assessment/Calculations {Does this patient have ATRIAL FIBRILLATION?[phone]} No BP recorded.  {Refresh Note OR Click here to enter BP  :1}***       Physical Exam VS:  There were no vitals taken for this visit.       Wt Readings from Last 3 Encounters:  11/21/20 234 lb (106.1 kg)  09/04/19 234 lb (106.1 kg)  08/21/19 234 lb (106.1 kg)    GEN: Well nourished, well developed in no acute distress NECK: No JVD; No carotid bruits CARDIAC: ***RRR, no murmurs, rubs, gallops RESPIRATORY:  Clear to auscultation without rales, wheezing or rhonchi  ABDOMEN: Soft, non-tender, non-distended EXTREMITIES:  No edema; No deformity   ASSESSMENT AND PLAN ***    {Are you ordering a CV Procedure (e.g. stress test, cath, DCCV, TEE, etc)?   Press F2        :[id number]}  Dispo: ***  Signed, Rollo FABIENE Louder, PA-C   "

## 2024-06-02 ENCOUNTER — Other Ambulatory Visit: Payer: Self-pay

## 2024-06-02 ENCOUNTER — Encounter: Payer: Self-pay | Admitting: Physician Assistant

## 2024-06-02 ENCOUNTER — Ambulatory Visit: Attending: Physician Assistant | Admitting: Physician Assistant

## 2024-06-02 ENCOUNTER — Other Ambulatory Visit (HOSPITAL_BASED_OUTPATIENT_CLINIC_OR_DEPARTMENT_OTHER): Payer: Self-pay

## 2024-06-02 VITALS — BP 118/78 | HR 74 | Ht 68.5 in | Wt 227.0 lb

## 2024-06-02 DIAGNOSIS — E7849 Other hyperlipidemia: Secondary | ICD-10-CM

## 2024-06-02 DIAGNOSIS — I2089 Other forms of angina pectoris: Secondary | ICD-10-CM

## 2024-06-02 DIAGNOSIS — R002 Palpitations: Secondary | ICD-10-CM

## 2024-06-02 DIAGNOSIS — R079 Chest pain, unspecified: Secondary | ICD-10-CM

## 2024-06-02 MED ORDER — ROSUVASTATIN CALCIUM 10 MG PO TABS
10.0000 mg | ORAL_TABLET | Freq: Every day | ORAL | 3 refills | Status: AC
  Filled 2024-06-02: qty 30, 30d supply, fill #0

## 2024-06-02 MED ORDER — ASPIRIN 81 MG PO TBEC: 81.0000 mg | DELAYED_RELEASE_TABLET | Freq: Every day | ORAL | Status: AC

## 2024-06-02 MED ORDER — IVABRADINE HCL 5 MG PO TABS
10.0000 mg | ORAL_TABLET | Freq: Once | ORAL | 0 refills | Status: AC
  Filled 2024-06-02 (×2): qty 2, 1d supply, fill #0

## 2024-06-02 NOTE — Progress Notes (Signed)
 " Cardiology Office Note:    Date:  06/02/2024   ID:  Bonnie Bailey, DOB 11/27/1965, MRN [id number]  PCP:  Joshua Santana CROME, NP   [facility] Providers Cardiologist:  Georganna Archer, MD     Referring MD: Joshua Santana CROME, NP   Chief Complaint  Patient presents with   New Patient (Initial Visit)    palpitations    History of Present Illness:    Bonnie Bailey is a 59 y.o. female with a hx of prediabetes, spondylosis of cervical spine. Presents today for evaluation of palpitations   Patient was seen by their PCP on 05/23/24 for her annual exam. Reported palpitations. TSH within normal limits. Referred to cardiology.   She is here alone. She describes palpitations and chest pain that started 1 week ago.  She described heart fluttering that started 1 month ago associated with SOB, occurring several times per week usually when relaxing, not with exertion. Palpitations last for longer than 1 minute. No syncope but has gotten dizzy. She decreased caffeine intake prior to palpitations starting. She no longer takes magnesium , stopped years ago.   She had back pain and vomited for 2 days just prior to chest pain. CP started 1.5 weeks ago. 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 CP worse with walking up a hill today. She fell this week on ice, no syncope. CP preceded slip and fall.    She is a former smoker, smoked 10-20 years. No alcohol use, no THC containing products, no illicit drugs.  You live at home with husband. She has 2 grown children and 4 grandchildren.  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   Family history: Mother - liver failure from etoh and hepatitis Father - DM, HTN Siblings - healthy, 2 brothers alcoholics   Past Medical History:  Diagnosis Date   Chronic neck pain    HNP   Complication of anesthesia    hard to wake   Heart murmur    as a baby. Medical Md occasionally hears it now   History of bronchitis as a child    Hypoglycemia     Low back pain    DDD.scoliosis   Muscle spasm    takes Zanaflex  daily as needed   Neuromuscular disorder (HCC)    Neuropathy    right side    Osteoarthritis    Weakness    numbness and tingling on left side    Past Surgical History:  Procedure Laterality Date   ANTERIOR CERVICAL DECOMP/DISCECTOMY FUSION N/A 10/16/2015   Procedure: Anterior cervical decompression fusion cervical four-five, cervical five-six, cervical six-seven;  Surgeon: Arley Helling, MD;  Location: [facility];  Service: Neurosurgery;  Laterality: N/A;   CESAREAN SECTION  2000   MOUTH SURGERY     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   TUBAL LIGATION      Current Medications: Active Medications[1]   Allergies:   Penicillins, Sulfa antibiotics, Iodine, Latex, and Adhesive [tape]   Social History   Socioeconomic History   Marital status: Married    Spouse name: Not on file   Number of children: Not on file   Years of education: Not on file   Highest education level: Not on file  Occupational History   Not on file  Tobacco Use   Smoking status: Former    Current packs/day: 0.50    Average packs/day: 0.5 packs/day for 20.0 years (10.0 ttl pk-yrs)  Types: Cigarettes   Smokeless tobacco: Never  Vaping Use   Vaping status: Former  Substance and Sexual Activity   Alcohol use: No   Drug use: No   Sexual activity: Yes  Other Topics Concern   Not on file  Social History Narrative   Not on file   Social Drivers of Health   Tobacco Use: Medium Risk (05/23/2024)   Received from [facility]   Patient History    Smoking Tobacco Use: Former    Smokeless Tobacco Use: Never    Passive Exposure: Not on Actuary Strain: Not on file  Food Insecurity: Low Risk (05/23/2024)   Received from [facility]   Epic    Within the past 12 months, you worried that your food would run out before you got money to buy more: Never true    Within the past 12 months, the food you bought  just didn't last and you didn't have money to get more. : Never true  Transportation Needs: No Transportation Needs (05/23/2024)   Received from [facility]    In the past 12 months, has lack of reliable transportation kept you from medical appointments, meetings, work or from getting things needed for daily living? : No  Physical Activity: Not on file  Stress: Not on file  Social Connections: Not on file  Depression (EYV7-0): Not on file  Alcohol Screen: Not on file  Housing: Low Risk (05/23/2024)   Received from [facility]   Epic    What is your living situation today?: I have a steady place to live    Think about the place you live. Do you have problems with any of the following? Choose all that apply:: None/None on this list  Utilities: Low Risk (05/23/2024)   Received from [facility]   Utilities    In the past 12 months has the electric, gas, oil, or water company threatened to shut off services in your home? : No  Health Literacy: Not on file     Family History: The patient's family history includes Colon polyps (age of onset: 32) in her sister. There is no history of Colon cancer, Esophageal cancer, Rectal cancer, or Stomach cancer.  ROS:   Please see the history of present illness.     All other systems reviewed and are negative.  EKGs/Labs/Other Studies Reviewed:    The following studies were reviewed today:  EKG Interpretation Date/Time:  Friday June 02 2024 15:44:27 EST Ventricular Rate:  73 PR Interval:  180 QRS Duration:  82 QT Interval:  418 QTC Calculation: 460 R Axis:   2  Text Interpretation: Normal sinus rhythm Low voltage QRS Cannot rule out Inferior infarct , age undetermined Cannot rule out Anterior infarct , age undetermined When compared with ECG of 02-Oct-2017 22:44, No significant change was found Confirmed by Madie Slough ([id number]) on 06/02/2024 3:46:36 PM    Recent Labs: No results found for requested labs within last 365  days.  Recent Lipid Panel No results found for: CHOL, TRIG, HDL, CHOLHDL, VLDL, LDLCALC, LDLDIRECT   Risk Assessment/Calculations:                Physical Exam:    VS:  BP 118/78   Pulse 74   Ht 5' 8.5 (1.74 m)   Wt 227 lb (103 kg)   SpO2 95%   BMI 34.01 kg/m     Wt Readings from Last 3 Encounters:  06/02/24 227 lb (103  kg)  11/21/20 234 lb (106.1 kg)  09/04/19 234 lb (106.1 kg)     GEN:  Well nourished, well developed in no acute distress HEENT: Normal NECK: No JVD; No carotid bruits LYMPHATICS: No lymphadenopathy CARDIAC: RRR, no murmurs, rubs, gallops RESPIRATORY:  Clear to auscultation without rales, wheezing or rhonchi  ABDOMEN: Soft, non-tender, non-distended MUSCULOSKELETAL:  No edema; No deformity  SKIN: Warm and dry NEUROLOGIC:  Alert and oriented x 3 PSYCHIATRIC:  Normal affect   ASSESSMENT:    1. Heart palpitations   2. Stable angina pectoris   3. Other hyperlipidemia   4. Chest pain of uncertain etiology    PLAN:    In order of problems listed above:  Chest pain - concerning for exertional angina, relieved with rest - CP with walking up a hill  - will obtain CT coronary and echocardiogram - start ASA 81 mg - NTG PRN   Palpitations  - Lab work through PCP in 05/2024 showed normal TSH, mag, potassium, hemoglobin  - restart Magnesium  citrate or taurate 200-400 mg daily - will plan for heart monitor after CCTA and echo   HLD with LDL goal < 70 - Lipid panel from 05/2024 showed LDL 140, HDL 66, triglycerides 105  - pharmD for lipid clinic - 10 mg crestor  daily - LPA with next lab draw   Follow up with Dr. Floretta after echo.   Medication Adjustments/Labs and Tests Ordered: Current medicines are reviewed at length with the patient today.  Concerns regarding medicines are outlined above.  Orders Placed This Encounter  Procedures   CT CORONARY MORPH W/CTA COR W/SCORE W/CA W/CM &/OR WO/CM   AMB Referral to [facility]  Pharm-D   EKG 12-Lead   ECHOCARDIOGRAM COMPLETE   Meds ordered this encounter  Medications   ivabradine  (CORLANOR) 5 MG TABS tablet    Sig: Take 2 tablets (10 mg total) by mouth once for 1 dose. Take 90-120 minutes prior to scan.    Dispense:  2 tablet    Refill:  0   aspirin  EC 81 MG tablet    Sig: Take 1 tablet (81 mg total) by mouth daily. Swallow whole.   rosuvastatin  (CRESTOR ) 10 MG tablet    Sig: Take 1 tablet (10 mg total) by mouth daily.    Dispense:  90 tablet    Refill:  3    Patient Instructions  Medication Instructions:  Your physician has recommended you make the following change in your medication:  START Crestor  10 mg taking 1 daily  START Aspirin  81 mg taking one daily .SABRA This is over the counter  *If you need a refill on your cardiac medications before your next appointment, please call your pharmacy*  Lab Work: None ordered  If you have labs (blood work) drawn today and your tests are completely normal, you will receive your results only by: MyChart Message (if you have MyChart) OR A paper copy in the mail If you have any lab test that is abnormal or we need to change your treatment, we will call you to review the results.  Testing/Procedures: Your physician has requested that you have an echocardiogram. Echocardiography is a painless test that uses sound waves to create images of your heart. It provides your doctor with information about the size and shape of your heart and how well your hearts chambers and valves are working. This procedure takes approximately one hour. There are no restrictions for this procedure. Please do NOT wear cologne, perfume, aftershave, or  lotions (deodorant is allowed). Please arrive 15 minutes prior to your appointment time.  Please note: We ask at that you not bring children with you during ultrasound (echo/ vascular) testing. Due to room size and safety concerns, children are not allowed in the ultrasound rooms during exams.  Our front office staff cannot provide observation of children in our lobby area while testing is being conducted. An adult accompanying a patient to their appointment will only be allowed in the ultrasound room at the discretion of the ultrasound technician under special circumstances. We apologize for any inconvenience.   Your physician has requested that you have cardiac CT. Cardiac computed tomography (CT) is a painless test that uses an x-ray machine to take clear, detailed pictures of your heart. For further information please visit [email or web address] Please follow instruction sheet BELOW:    Your cardiac CT will be scheduled at one of the below locations:   [facility] [address] [place], [place] [address] [phone] (Severe contrast allergies only)  OR   [facility] [address] [place], [place] [address] [phone]  OR   [facility] [address] [place], [place] [address] [phone]  OR   [facility]. [facility] and Vascular Tower [address]  [place], [place] [address]  OR   [facility] [address] [place], [place] [phone]  If scheduled at [facility], please arrive at the [facility] and Children's Entrance (Entrance C2) of [facility] 30 minutes prior to test start time. You can use the FREE valet parking offered at entrance C (encouraged to control the heart rate for the test)  Proceed to the [facility] Radiology Department (first floor) to check-in and test prep.  All radiology patients and guests should use entrance C2 at [facility], accessed from [facility], even though the hospital's physical address listed is [address]  If scheduled at the Heart and Vascular Tower at [address], please enter the parking lot using the [address] entrance and use the FREE valet service at the patient drop-off area. Enter the  building and check-in with registration on the main floor.  If scheduled at [facility], please arrive to the Heart and Vascular Center 15 mins early for check-in and test prep.  There is spacious parking and easy access to the radiology department from the [facility] Heart and Vascular entrance. Please enter here and check-in with the desk attendant.   If scheduled at [facility], please arrive 30 minutes early for check-in and test prep.  Please follow these instructions carefully (unless otherwise directed):  An IV will be required for this test and Nitroglycerin will be given.  Hold all erectile dysfunction medications at least 3 days (72 hrs) prior to test. (Ie viagra, cialis, sildenafil, tadalafil, etc)     On the Day of the Test: Drink plenty of water until 1 hour prior to the test. Do not eat any food 1 hour prior to test. You may take your regular medications prior to the test.  Take Ivabradine  5 mg taking 2 tablets 2 hours prior to the cardiac ct. FEMALES- please wear underwire-free bra if available, avoid dresses & tight clothing   After the Test: Drink plenty of water. After receiving IV contrast, you may experience a mild flushed feeling. This is normal. On occasion, you may experience a mild rash up to 24 hours after the test. This is  not dangerous. If this occurs, you can take Benadryl 25 mg, Zyrtec, Claritin, or Allegra and increase your fluid intake. (Patients taking Tikosyn should avoid Benadryl, and may take Zyrtec, Claritin, or Allegra) If you experience trouble breathing, this can be serious. If it is severe call 911 IMMEDIATELY. If it is mild, please call our office.  We will call to schedule your test 2-4 weeks out understanding that some insurance companies will need an authorization prior to the service being performed.   For more information and frequently asked questions, please visit our website :  [email or web address]  For non-scheduling related questions, please contact the cardiac imaging nurse navigator should you have any questions/concerns: Cardiac Imaging Nurse Navigators Direct Office Dial: [phone]   For scheduling needs, including cancellations and rescheduling, please call Brittany, [phone].  For billing questions, please call [phone].    Follow-Up: At [facility], you and your health needs are our priority.  As part of our continuing mission to provide you with exceptional heart care, our providers are all part of one team.  This team includes your primary Cardiologist (physician) and Advanced Practice Providers or APPs (Physician Assistants and Nurse Practitioners) who all work together to provide you with the care you need, when you need it.  Your next appointment:   After Echo  Provider:   Georganna Archer, MD    We recommend signing up for the patient portal called MyChart.  Sign up information is provided on this After Visit Summary.  MyChart is used to connect with patients for Virtual Visits (Telemedicine).  Patients are able to view lab/test results, encounter notes, upcoming appointments, etc.  Non-urgent messages can be sent to your provider as well.   To learn more about what you can do with MyChart, go to [email or web address].   Other Instructions             Signed, Jon Nat Hails, [place]  06/02/2024 4:31 PM     HeartCare     [1]  Current Meds  Medication Sig   aspirin  EC 81 MG tablet Take 1 tablet (81 mg total) by mouth daily. Swallow whole.   Bioflavonoid Products (GRAPE SEED EXTRACT PO) Take 1 tablet by mouth daily.   ivabradine  (CORLANOR) 5 MG TABS tablet Take 2 tablets (10 mg total) by mouth once for 1 dose. Take 90-120 minutes prior to scan.   rosuvastatin  (CRESTOR ) 10 MG tablet Take 1 tablet (10 mg total) by mouth daily.   tiZANidine  (ZANAFLEX ) 2 MG tablet Take 2 mg by mouth every 8  (eight) hours as needed for muscle spasms (muscle spasms).   [DISCONTINUED] tiZANidine  (ZANAFLEX ) 4 MG tablet Take 4 mg by mouth 2 (two) times daily.   "

## 2024-06-02 NOTE — Patient Instructions (Signed)
 Medication Instructions:  Your physician has recommended you make the following change in your medication:  START Crestor  10 mg taking 1 daily  START Aspirin  81 mg taking one daily .SABRA This is over the counter  *If you need a refill on your cardiac medications before your next appointment, please call your pharmacy*  Lab Work: None ordered  If you have labs (blood work) drawn today and your tests are completely normal, you will receive your results only by: MyChart Message (if you have MyChart) OR A paper copy in the mail If you have any lab test that is abnormal or we need to change your treatment, we will call you to review the results.  Testing/Procedures: Your physician has requested that you have an echocardiogram. Echocardiography is a painless test that uses sound waves to create images of your heart. It provides your doctor with information about the size and shape of your heart and how well your hearts chambers and valves are working. This procedure takes approximately one hour. There are no restrictions for this procedure. Please do NOT wear cologne, perfume, aftershave, or lotions (deodorant is allowed). Please arrive 15 minutes prior to your appointment time.  Please note: We ask at that you not bring children with you during ultrasound (echo/ vascular) testing. Due to room size and safety concerns, children are not allowed in the ultrasound rooms during exams. Our front office staff cannot provide observation of children in our lobby area while testing is being conducted. An adult accompanying a patient to their appointment will only be allowed in the ultrasound room at the discretion of the ultrasound technician under special circumstances. We apologize for any inconvenience.   Your physician has requested that you have cardiac CT. Cardiac computed tomography (CT) is a painless test that uses an x-ray machine to take clear, detailed pictures of your heart. For further information  please visit [email or web address] Please follow instruction sheet BELOW:    Your cardiac CT will be scheduled at one of the below locations:   [facility] [address] [place], [place] [address] [phone] (Severe contrast allergies only)  OR   [facility] [address] [place], [place] [address] [phone]  OR   [facility] [address] [place], [place] [address] [phone]  OR   [facility] BIRCH. [facility] and Vascular Tower [address]  [place], [place] [address]  OR   [facility]  [address] [place], [place] [phone]  If scheduled at [facility], please arrive at the [facility] and Children's Entrance (Entrance C2) of [facility] 30 minutes prior to test start time. You can use the FREE valet parking offered at entrance C (encouraged to control the heart rate for the test)  Proceed to the [facility] Radiology Department (first floor) to check-in and test prep.  All radiology patients and guests should use entrance C2 at [facility], accessed from [facility], even though the hospital's physical address listed is [address]  If scheduled at the Heart and Vascular Tower at [address], please enter the parking lot using the [address] entrance and use the FREE valet service at the patient drop-off area. Enter the building and check-in with registration on the main floor.  If scheduled at [facility], please arrive to the Heart and Vascular Center 15 mins early for check-in and test prep.  There is spacious parking and easy access to  the radiology department from the [facility] Heart and Vascular entrance. Please enter here and check-in with the desk attendant.   If scheduled at [facility], please arrive 30 minutes early for check-in and test prep.  Please follow these instructions carefully (unless otherwise  directed):  An IV will be required for this test and Nitroglycerin will be given.  Hold all erectile dysfunction medications at least 3 days (72 hrs) prior to test. (Ie viagra, cialis, sildenafil, tadalafil, etc)     On the Day of the Test: Drink plenty of water until 1 hour prior to the test. Do not eat any food 1 hour prior to test. You may take your regular medications prior to the test.  Take Ivabradine  5 mg taking 2 tablets 2 hours prior to the cardiac ct. FEMALES- please wear underwire-free bra if available, avoid dresses & tight clothing   After the Test: Drink plenty of water. After receiving IV contrast, you may experience a mild flushed feeling. This is normal. On occasion, you may experience a mild rash up to 24 hours after the test. This is not dangerous. If this occurs, you can take Benadryl 25 mg, Zyrtec, Claritin, or Allegra and increase your fluid intake. (Patients taking Tikosyn should avoid Benadryl, and may take Zyrtec, Claritin, or Allegra) If you experience trouble breathing, this can be serious. If it is severe call 911 IMMEDIATELY. If it is mild, please call our office.  We will call to schedule your test 2-4 weeks out understanding that some insurance companies will need an authorization prior to the service being performed.   For more information and frequently asked questions, please visit our website : [email or web address]  For non-scheduling related questions, please contact the cardiac imaging nurse navigator should you have any questions/concerns: Cardiac Imaging Nurse Navigators Direct Office Dial: [phone]   For scheduling needs, including cancellations and rescheduling, please call Brittany, [phone].  For billing questions, please call [phone].    Follow-Up: At [facility], you and your health needs are our priority.  As part of our continuing mission to provide you with exceptional heart care, our providers are  all part of one team.  This team includes your primary Cardiologist (physician) and Advanced Practice Providers or APPs (Physician Assistants and Nurse Practitioners) who all work together to provide you with the care you need, when you need it.  Your next appointment:   After Echo  Provider:   Georganna Archer, MD    We recommend signing up for the patient portal called MyChart.  Sign up information is provided on this After Visit Summary.  MyChart is used to connect with patients for Virtual Visits (Telemedicine).  Patients are able to view lab/test results, encounter notes, upcoming appointments, etc.  Non-urgent messages can be sent to your provider as well.   To learn more about what you can do with MyChart, go to [email or web address].   Other Instructions

## 2024-06-05 ENCOUNTER — Other Ambulatory Visit (HOSPITAL_COMMUNITY): Payer: Self-pay

## 2024-06-05 ENCOUNTER — Ambulatory Visit: Admitting: Cardiology

## 2024-06-05 ENCOUNTER — Other Ambulatory Visit: Payer: Self-pay

## 2024-06-19 ENCOUNTER — Ambulatory Visit (HOSPITAL_COMMUNITY)

## 2024-07-05 ENCOUNTER — Ambulatory Visit (HOSPITAL_COMMUNITY)

## 2024-07-10 ENCOUNTER — Ambulatory Visit: Admitting: Pharmacist

## 2024-07-11 ENCOUNTER — Ambulatory Visit: Admitting: Student in an Organized Health Care Education/Training Program
